# Patient Record
Sex: Female | Born: 1980
Health system: Southern US, Community
[De-identification: ages and names within clinical notes are randomized; demographics above are authoritative.]

## PROBLEM LIST (undated history)

## (undated) DIAGNOSIS — I10 Essential (primary) hypertension: Secondary | ICD-10-CM

## (undated) DIAGNOSIS — K219 Gastro-esophageal reflux disease without esophagitis: Secondary | ICD-10-CM

## (undated) DIAGNOSIS — G43909 Migraine, unspecified, not intractable, without status migrainosus: Secondary | ICD-10-CM

## (undated) DIAGNOSIS — G4733 Obstructive sleep apnea (adult) (pediatric): Secondary | ICD-10-CM

## (undated) DIAGNOSIS — I2699 Other pulmonary embolism without acute cor pulmonale: Secondary | ICD-10-CM

## (undated) DIAGNOSIS — G4730 Sleep apnea, unspecified: Secondary | ICD-10-CM

## (undated) HISTORY — DX: Sleep apnea, unspecified: G47.30

## (undated) HISTORY — PX: ABDOMINAL HYSTERECTOMY: SHX81

---

## 2005-12-12 ENCOUNTER — Ambulatory Visit: Payer: Self-pay | Admitting: Infectious Diseases

## 2005-12-12 LAB — CONVERTED CEMR LAB
BUN: 14 mg/dL (ref 6–23)
Chloride: 108 meq/L (ref 96–112)
HIV-1 RNA Quant, Log: <1.7 (ref <1.70)
Hemoglobin: 10.8 g/dL — ABNORMAL LOW (ref 11.7–14.8)
Potassium: 4.1 meq/L (ref 3.5–5.3)
RBC: 4.54 M/uL (ref 3.79–4.96)
RDW: 16.1 % — ABNORMAL HIGH (ref 11.5–15.3)
WBC: 5.8 10*3/uL (ref 3.7–10.0)

## 2010-12-01 ENCOUNTER — Emergency Department (HOSPITAL_BASED_OUTPATIENT_CLINIC_OR_DEPARTMENT_OTHER)
Admission: EM | Admit: 2010-12-01 | Discharge: 2010-12-01 | Disposition: A | Discharge status: Home / Self Care | Payer: Medicaid Other | Attending: Emergency Medicine | Admitting: Emergency Medicine

## 2010-12-01 ENCOUNTER — Encounter: Payer: Self-pay | Admitting: *Deleted

## 2010-12-01 ENCOUNTER — Emergency Department (INDEPENDENT_AMBULATORY_CARE_PROVIDER_SITE_OTHER): Payer: Medicaid Other

## 2010-12-01 DIAGNOSIS — R109 Unspecified abdominal pain: Secondary | ICD-10-CM | POA: Insufficient documentation

## 2010-12-01 DIAGNOSIS — N760 Acute vaginitis: Secondary | ICD-10-CM | POA: Insufficient documentation

## 2010-12-01 DIAGNOSIS — D259 Leiomyoma of uterus, unspecified: Secondary | ICD-10-CM

## 2010-12-01 LAB — URINALYSIS, ROUTINE W REFLEX MICROSCOPIC
Nitrite: NEGATIVE
Specific Gravity, Urine: 1.03 (ref 1.005–1.030)
pH: 5.5 (ref 5.0–8.0)

## 2010-12-01 LAB — WET PREP, GENITAL
Trich, Wet Prep: NONE SEEN
Yeast Wet Prep HPF POC: NONE SEEN

## 2010-12-01 MED ORDER — METRONIDAZOLE 500 MG PO TABS: 500.0000 mg | ORAL_TABLET | Freq: Two times a day (BID) | ORAL | Status: AC

## 2010-12-01 NOTE — ED Notes (Signed)
Pt sts she has not had a menstrual period for 6 years due to depo but recently switched to implenon and began having her period yesterday. She is c/o abd cramps, vag bleeding and HA.

## 2010-12-01 NOTE — ED Notes (Signed)
MD at bedside. 

## 2010-12-01 NOTE — ED Provider Notes (Signed)
History     CSN: [id number] Arrival date & time: 12/01/2010  1:51 PM   First MD Initiated Contact with Patient 12/01/10 1353      Chief Complaint  Patient presents with  . Abdominal Pain    (Consider location/radiation/quality/duration/timing/severity/associated sxs/prior treatment) Patient is a 30 y.o. female presenting with vaginal bleeding. The history is provided by the patient. No language interpreter was used.  Vaginal Bleeding This is a new problem. The current episode started yesterday. The problem occurs constantly. The problem has been unchanged. Associated symptoms include abdominal pain. The symptoms are aggravated by nothing. She has tried nothing for the symptoms. The treatment provided moderate relief.  Pt complains of vaginal bleeding.  Pt reports she has not had a period in 5 years.  Pt complains of cramping  History reviewed. No pertinent past medical history.  Past Surgical History  Procedure Date  . Cesarean section     No family history on file.  History  Substance Use Topics  . Smoking status: Never Smoker   . Smokeless tobacco: Not on file  . Alcohol Use: Yes    OB History    Grav Para Term Preterm Abortions TAB SAB Ect Mult Living                  Review of Systems  Unable to perform ROS Gastrointestinal: Positive for abdominal pain.  Genitourinary: Positive for vaginal bleeding.  All other systems reviewed and are negative.    Allergies  Ciprofloxacin  Home Medications  No current outpatient prescriptions on file.  BP 124/71  Pulse 73  Temp(Src) 98.5 F (36.9 C) (Oral)  Resp 18  SpO2 100%  LMP 11/30/2010  Physical Exam  Nursing note and vitals reviewed. Constitutional: She appears well-developed and well-nourished.  HENT:  Head: Normocephalic.  Eyes: Pupils are equal, round, and reactive to light.  Neck: Normal range of motion.  Cardiovascular: Normal rate.   Pulmonary/Chest: Effort normal.  Abdominal: Soft.    Genitourinary: Uterus normal.       Vaginal bleeding,  Cervix nontender,  Adnexa nontender  Musculoskeletal: Normal range of motion.  Neurological: She is alert.  Skin: Skin is warm.    ED Course  Procedures (including critical care time)  Labs Reviewed  URINALYSIS, ROUTINE W REFLEX MICROSCOPIC - Abnormal; Notable for the following:    Color, Urine AMBER (*) BIOCHEMICALS MAY BE AFFECTED BY COLOR   Appearance CLOUDY (*)    Hgb urine dipstick LARGE (*)    Bilirubin Urine SMALL (*)    Ketones, ur 15 (*)    Leukocytes, UA SMALL (*)    All other components within normal limits  WET PREP, GENITAL - Abnormal; Notable for the following:    Clue Cells, Wet Prep FEW (*)    WBC, Wet Prep HPF POC RARE (*)    All other components within normal limits  URINE MICROSCOPIC-ADD ON - Abnormal; Notable for the following:    Bacteria, UA FEW (*)    All other components within normal limits  PREGNANCY, URINE  GC/CHLAMYDIA PROBE AMP, GENITAL   No results found.   No diagnosis found.    MDM  Few clue cells    Results for orders placed during the hospital encounter of 12/01/10  URINALYSIS, ROUTINE W REFLEX MICROSCOPIC      Component Value Range   Color, Urine AMBER (*) YELLOW    Appearance CLOUDY (*) CLEAR    Specific Gravity, Urine 1.030  1.005 - 1.030  pH 5.5  5.0 - 8.0    Glucose, UA NEGATIVE  NEGATIVE (mg/dL)   Hgb urine dipstick LARGE (*) NEGATIVE    Bilirubin Urine SMALL (*) NEGATIVE    Ketones, ur 15 (*) NEGATIVE (mg/dL)   Protein, ur NEGATIVE  NEGATIVE (mg/dL)   Urobilinogen, UA 1.0  0.0 - 1.0 (mg/dL)   Nitrite NEGATIVE  NEGATIVE    Leukocytes, UA SMALL (*) NEGATIVE   PREGNANCY, URINE      Component Value Range   Preg Test, Ur NEGATIVE    WET PREP, GENITAL      Component Value Range   Yeast, Wet Prep NONE SEEN  NONE SEEN    Trich, Wet Prep NONE SEEN  NONE SEEN    Clue Cells, Wet Prep FEW (*) NONE SEEN    WBC, Wet Prep HPF POC RARE (*) NONE SEEN   URINE  MICROSCOPIC-ADD ON      Component Value Range   Squamous Epithelial / LPF RARE  RARE    WBC, UA 3-6  <3 (WBC/hpf)   RBC / HPF TOO NUMEROUS TO COUNT  <3 (RBC/hpf)   Bacteria, UA FEW (*) RARE    Urine-Other MUCOUS PRESENT     US Transvaginal Non-ob  12/01/2010  *RADIOLOGY REPORT*  Clinical Data: Heavy bleeding.  TRANSABDOMINAL AND TRANSVAGINAL ULTRASOUND OF PELVIS  Technique:  Both transabdominal and transvaginal ultrasound examinations of the pelvis were performed including evaluation of the uterus, ovaries, adnexal regions, and pelvic cul-de-sac.  Comparison: None.  Findings:  Uterus: 8.4 x 3.6 x 4.5 cm.  Mildly heterogeneous echotexture. Probable single small intramural fibroid measuring 1 cm.  Endometrium: 5 mm in thickness.  Small amount of fluid in the endometrium within the cervix.  Right Ovary: Not visualized.  No adnexal masses visualized.  Left Ovary: Not visualized.  No adnexal masses visualized.  Other Findings:  No free fluid.  IMPRESSION: Single small left intramural fibroid, 1 cm.  Ovaries not visualized, but no adnexal masses seen.  Original Report Authenticated By: Cyndie Chime, M.D.   US Pelvis Limited  12/01/2010  *RADIOLOGY REPORT*  Clinical Data: Heavy bleeding.  TRANSABDOMINAL AND TRANSVAGINAL ULTRASOUND OF PELVIS  Technique:  Both transabdominal and transvaginal ultrasound examinations of the pelvis were performed including evaluation of the uterus, ovaries, adnexal regions, and pelvic cul-de-sac.  Comparison: None.  Findings:  Uterus: 8.4 x 3.6 x 4.5 cm.  Mildly heterogeneous echotexture. Probable single small intramural fibroid measuring 1 cm.  Endometrium: 5 mm in thickness.  Small amount of fluid in the endometrium within the cervix.  Right Ovary: Not visualized.  No adnexal masses visualized.  Left Ovary: Not visualized.  No adnexal masses visualized.  Other Findings:  No free fluid.  IMPRESSION: Single small left intramural fibroid, 1 cm.  Ovaries not visualized, but no  adnexal masses seen.  Original Report Authenticated By: Cyndie Chime, M.D.       Langston Masker, [place] 12/01/10 1958

## 2010-12-01 NOTE — ED Notes (Signed)
Chart reviewed and care assumed. Awaiting ultrasound.  Pt informed of plan of care.

## 2010-12-02 LAB — GC/CHLAMYDIA PROBE AMP, GENITAL: GC Probe Amp, Genital: NEGATIVE

## 2010-12-02 NOTE — ED Provider Notes (Signed)
Medical screening examination/treatment/procedure(s) were performed by non-physician practitioner and as supervising physician I was immediately available for consultation/collaboration.   Celene Kras, MD 12/02/10 [phone]

## 2011-07-29 ENCOUNTER — Encounter (HOSPITAL_BASED_OUTPATIENT_CLINIC_OR_DEPARTMENT_OTHER): Payer: Self-pay | Admitting: *Deleted

## 2011-07-29 ENCOUNTER — Emergency Department (HOSPITAL_BASED_OUTPATIENT_CLINIC_OR_DEPARTMENT_OTHER)
Admission: EM | Admit: 2011-07-29 | Discharge: 2011-07-29 | Disposition: A | Discharge status: Home / Self Care | Payer: No Typology Code available for payment source | Attending: Emergency Medicine | Admitting: Emergency Medicine

## 2011-07-29 DIAGNOSIS — S0093XA Contusion of unspecified part of head, initial encounter: Secondary | ICD-10-CM

## 2011-07-29 DIAGNOSIS — Y998 Other external cause status: Secondary | ICD-10-CM | POA: Insufficient documentation

## 2011-07-29 DIAGNOSIS — S1093XA Contusion of unspecified part of neck, initial encounter: Secondary | ICD-10-CM | POA: Insufficient documentation

## 2011-07-29 DIAGNOSIS — Y93I9 Activity, other involving external motion: Secondary | ICD-10-CM | POA: Insufficient documentation

## 2011-07-29 DIAGNOSIS — S0003XA Contusion of scalp, initial encounter: Secondary | ICD-10-CM | POA: Insufficient documentation

## 2011-07-29 LAB — PREGNANCY, URINE: Preg Test, Ur: NEGATIVE

## 2011-07-29 MED ORDER — HYDROCODONE-ACETAMINOPHEN 5-325 MG PO TABS: 2.0000 | ORAL_TABLET | ORAL | Status: DC | PRN

## 2011-07-29 MED ORDER — HYDROCODONE-ACETAMINOPHEN 5-325 MG PO TABS: 2.0000 | ORAL_TABLET | ORAL | Status: AC | PRN

## 2011-07-29 NOTE — ED Provider Notes (Signed)
History     CSN: [id number]  Arrival date & time 07/29/11  1407   First MD Initiated Contact with Patient 07/29/11 1534      Chief Complaint  Patient presents with  . Optician, dispensing    (Consider location/radiation/quality/duration/timing/severity/associated sxs/prior treatment) Patient is a 31 y.o. female presenting with headaches. The history is provided by the patient. No language interpreter was used.  Headache  This is a new problem. The current episode started yesterday. The problem occurs constantly. The problem has been gradually worsening. The headache is associated with nothing. The patient is experiencing no pain. The pain does not radiate. She has tried nothing for the symptoms.  Pt complains of a headache since yesterday.  Pt reports she hit her head.  Pt did not lose conciousness  History reviewed. No pertinent past medical history.  Past Surgical History  Procedure Date  . Cesarean section     History reviewed. No pertinent family history.  History  Substance Use Topics  . Smoking status: Never Smoker   . Smokeless tobacco: Not on file  . Alcohol Use: Yes    OB History    Grav Para Term Preterm Abortions TAB SAB Ect Mult Living                  Review of Systems  Neurological: Positive for headaches.  All other systems reviewed and are negative.    Allergies  Ciprofloxacin  Home Medications   Current Outpatient Rx  Name Route Sig Dispense Refill  . MIDOL COMPLETE PO Oral Take 1 tablet by mouth daily as needed. Patient used this medication for cramps.    . IBUPROFEN 800 MG PO TABS Oral Take 800 mg by mouth every 8 (eight) hours as needed. Patient used this medication for her headache.      BP 135/81  Pulse 70  Temp 98.2 F (36.8 C) (Oral)  Resp 20  Ht 6' (1.829 m)  Wt 275 lb (124.739 kg)  BMI 37.30 kg/m2  SpO2 100%  Physical Exam  Nursing note and vitals reviewed. Constitutional: She is oriented to person, place, and time. She  appears well-developed and well-nourished.  HENT:  Head: Normocephalic and atraumatic.  Right Ear: External ear normal.  Left Ear: External ear normal.  Eyes: Conjunctivae and EOM are normal. Pupils are equal, round, and reactive to light.  Neck: Normal range of motion. Neck supple.  Cardiovascular: Normal rate and normal heart sounds.   Pulmonary/Chest: Effort normal.  Abdominal: Soft.  Musculoskeletal: Normal range of motion.  Neurological: She is alert and oriented to person, place, and time.  Skin: Skin is warm.    ED Course  Procedures (including critical care time)   Labs Reviewed  PREGNANCY, URINE   No results found.   1. Contusion of head       MDM  I doubt brain injury or fracture.  Pt given hydrocodone.   I advised pt to follow up with Dr. Pearletha Forge for recheck.        Lonia Skinner [place], [place] 07/29/11 1805
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
## 2011-08-02 NOTE — ED Provider Notes (Signed)
Medical screening examination/treatment/procedure(s) were performed by non-physician practitioner and as supervising physician I was immediately available for consultation/collaboration.  Cyndra Numbers, MD 08/02/11 [phone]

## 2011-09-08 ENCOUNTER — Emergency Department (HOSPITAL_BASED_OUTPATIENT_CLINIC_OR_DEPARTMENT_OTHER)
Admission: EM | Admit: 2011-09-08 | Discharge: 2011-09-08 | Disposition: A | Discharge status: Home / Self Care | Payer: Medicaid Other | Attending: Emergency Medicine | Admitting: Emergency Medicine

## 2011-09-08 ENCOUNTER — Encounter (HOSPITAL_BASED_OUTPATIENT_CLINIC_OR_DEPARTMENT_OTHER): Payer: Self-pay | Admitting: *Deleted

## 2011-09-08 DIAGNOSIS — L0390 Cellulitis, unspecified: Secondary | ICD-10-CM

## 2011-09-08 DIAGNOSIS — L02419 Cutaneous abscess of limb, unspecified: Secondary | ICD-10-CM | POA: Insufficient documentation

## 2011-09-08 DIAGNOSIS — S90569A Insect bite (nonvenomous), unspecified ankle, initial encounter: Secondary | ICD-10-CM | POA: Insufficient documentation

## 2011-09-08 DIAGNOSIS — Z881 Allergy status to other antibiotic agents status: Secondary | ICD-10-CM | POA: Insufficient documentation

## 2011-09-08 DIAGNOSIS — L03119 Cellulitis of unspecified part of limb: Secondary | ICD-10-CM | POA: Insufficient documentation

## 2011-09-08 MED ORDER — CEPHALEXIN 500 MG PO CAPS: 500.0000 mg | ORAL_CAPSULE | Freq: Four times a day (QID) | ORAL | Status: AC

## 2011-09-08 MED ORDER — SULFAMETHOXAZOLE-TRIMETHOPRIM 800-160 MG PO TABS: 2.0000 | ORAL_TABLET | Freq: Two times a day (BID) | ORAL | Status: AC

## 2011-09-08 NOTE — ED Provider Notes (Signed)
Medical screening examination/treatment/procedure(s) were conducted as a shared visit with non-physician practitioner(s) and myself.  I personally evaluated the patient during the encounter  Lower leg with localized anterior papules/petechiae warmth/erythema/tender no vessicles, no pus, no fluctuance, calf NT, not circumferential, doubt nec fasc/sepsis.  Hurman Horn, MD 09/09/11 [phone]

## 2011-09-08 NOTE — ED Notes (Signed)
Pt thinks she was bitten by a bug on her left lower ext. Redness has spread. +itching and burning.

## 2011-09-08 NOTE — ED Provider Notes (Signed)
History     CSN: [id number]  Arrival date & time 09/08/11  1925   First MD Initiated Contact with Patient 09/08/11 1957      Chief Complaint  Patient presents with  . Insect Bite    (Consider location/radiation/quality/duration/timing/severity/associated sxs/prior treatment) HPI Comments: Patient presenting with a rash of the left lower leg that has been present since last evening.  Area gradually spreading.  No drainage from the area.  Area is painful, but does not itch.  No known insect bite.  No known contact exposure.  She has never had anything like this before.  She denies any trauma to the area or breakage in skin.  She denies any pain or burning prior to the development of the rash.  She denies fever or chills.  No nausea or vomiting.  She has not tried any type of treatment prior to coming to the ED.    The history is provided by the patient.    History reviewed. No pertinent past medical history.  Past Surgical History  Procedure Date  . Cesarean section     History reviewed. No pertinent family history.  History  Substance Use Topics  . Smoking status: Never Smoker   . Smokeless tobacco: Not on file  . Alcohol Use: Yes    OB History    Grav Para Term Preterm Abortions TAB SAB Ect Mult Living                  Review of Systems  Constitutional: Negative for fever and chills.  HENT: Negative for sore throat, neck pain and neck stiffness.   Gastrointestinal: Negative for nausea and vomiting.  Skin: Positive for rash.  Neurological: Negative for headaches.    Allergies  Ciprofloxacin  Home Medications   Current Outpatient Rx  Name Route Sig Dispense Refill  . IBUPROFEN 200 MG PO TABS Oral Take 200 mg by mouth every 6 (six) hours as needed. For headache.    Marland Kitchen MIDOL COMPLETE PO Oral Take 1 tablet by mouth daily as needed. Patient used this medication for cramps.      BP 133/82  Pulse 82  Temp 98.1 F (36.7 C) (Oral)  Resp 20  Ht 6' (1.829 m)  Wt  299 lb (135.626 kg)  BMI 40.55 kg/m2  SpO2 100%  LMP 08/23/2011  Physical Exam  Nursing note and vitals reviewed. Constitutional: She appears well-developed and well-nourished. No distress.  HENT:  Head: Normocephalic and atraumatic.  Mouth/Throat: Oropharynx is clear and moist.  Neck: Normal range of motion. Neck supple.  Cardiovascular: Normal rate, regular rhythm and normal heart sounds.   Pulmonary/Chest: Effort normal and breath sounds normal. No respiratory distress. She has no wheezes.  Musculoskeletal: Normal range of motion.  Neurological: She is alert.  Skin: Rash noted. She is not diaphoretic.     Psychiatric: She has a normal mood and affect.    ED Course  Procedures (including critical care time)  Labs Reviewed - No data to display No results found.   No diagnosis found.    MDM  Rash consistent with localized cellulitis.  Patient afebrile.  No systemic symptoms.  Patient discharged home with prescription for Bactrim DS and Keflex.  Return precautions have been discussed.        Pascal Lux [place], PA-C 09/08/11 2211

## 2015-05-23 ENCOUNTER — Encounter (HOSPITAL_BASED_OUTPATIENT_CLINIC_OR_DEPARTMENT_OTHER): Payer: Self-pay

## 2015-05-23 ENCOUNTER — Emergency Department (HOSPITAL_BASED_OUTPATIENT_CLINIC_OR_DEPARTMENT_OTHER)
Admission: EM | Admit: 2015-05-23 | Discharge: 2015-05-23 | Disposition: A | Discharge status: Home / Self Care | Payer: Medicaid Other | Attending: Emergency Medicine | Admitting: Emergency Medicine

## 2015-05-23 DIAGNOSIS — M25562 Pain in left knee: Secondary | ICD-10-CM | POA: Diagnosis present

## 2015-05-23 MED ORDER — IBUPROFEN 400 MG PO TABS: 400.0000 mg | ORAL_TABLET | Freq: Four times a day (QID) | ORAL | Status: DC | PRN

## 2015-05-23 MED FILL — IBUPROFEN 400 MG TABLET: 400 | 7 days supply | Qty: 30 | Fill #0

## 2015-05-23 NOTE — ED Provider Notes (Signed)
CSN: [id number]     Arrival date & time 05/23/15  1608 History   First MD Initiated Contact with Patient 05/23/15 1620     Chief Complaint  Patient presents with  . Knee Pain     (Consider location/radiation/quality/duration/timing/severity/associated sxs/prior Treatment) HPI Comments: Patient presents to the ED with a chief complaint of left knee pain.  She states that she slept on her knee wrong.  She denies any other injuries.  She states that it feels tight when she walks.  She denies any clicking, locking, or popping.  She denies any fevers or chills.  Denies any calf pain or swelling.  She has not taken anything for her symptoms.  The history is provided by the patient. No language interpreter was used.    History reviewed. No pertinent past medical history. Past Surgical History  Procedure Laterality Date  . Cesarean section    . Cesarean section     No family history on file. Social History  Substance Use Topics  . Smoking status: Never Smoker   . Smokeless tobacco: None  . Alcohol Use: Yes   OB History    No data available     Review of Systems  Constitutional: Negative for fever and chills.  Respiratory: Negative for shortness of breath.   Cardiovascular: Negative for chest pain.  Gastrointestinal: Negative for nausea, vomiting, diarrhea and constipation.  Genitourinary: Negative for dysuria.  Musculoskeletal: Positive for arthralgias.      Allergies  Ciprofloxacin  Home Medications   Prior to Admission medications   Not on File   BP 133/78 mmHg  Pulse 88  Temp(Src) 99.2 F (37.3 C) (Oral)  Resp 18  Ht 6' (1.829 m)  Wt 131.543 kg  BMI 39.32 kg/m2  SpO2 100% Physical Exam Physical Exam  Constitutional: Pt appears well-developed and well-nourished. No distress.  HENT:  Head: Normocephalic and atraumatic.  Eyes: Conjunctivae are normal.  Neck: Normal range of motion.  Cardiovascular: Normal rate, regular rhythm and intact distal pulses.    Capillary refill < 3 sec  Pulmonary/Chest: Effort normal and breath sounds normal.  Musculoskeletal: Pt exhibits tenderness at the anterolateral joint line. Pt exhibits no edema.  ROM: 5/5  Neurological: Pt  is alert. Coordination normal.  Sensation 5/5 Strength 5/5  Skin: Skin is warm and dry. Pt is not diaphoretic.  No tenting of the skin  Psychiatric: Pt has a normal mood and affect.  Nursing note and vitals reviewed.  ED Course  Procedures (including critical care time)   MDM   Final diagnoses:  Left knee pain    Patient with left knee pain.  No injuries.  Ambulatory.  No sign of septic joint, dvt, or infection.  No bony abnormality or deformity.  Plan for knee sleeve.  Recommend ortho follow-up for evaluation of meniscus.       Montine Circle, PA-C 05/23/15 1647  Fredia Sorrow, MD 05/23/15 [phone]

## 2015-05-23 NOTE — Discharge Instructions (Signed)

## 2015-05-23 NOTE — ED Notes (Signed)
Left knee pain started last night-denies injury-slow steady gait

## 2016-04-11 ENCOUNTER — Encounter (HOSPITAL_BASED_OUTPATIENT_CLINIC_OR_DEPARTMENT_OTHER): Payer: Self-pay | Admitting: Emergency Medicine

## 2016-04-11 ENCOUNTER — Emergency Department (HOSPITAL_BASED_OUTPATIENT_CLINIC_OR_DEPARTMENT_OTHER)
Admission: EM | Admit: 2016-04-11 | Discharge: 2016-04-11 | Disposition: A | Discharge status: Home / Self Care | Payer: Medicaid Other | Attending: Emergency Medicine | Admitting: Emergency Medicine

## 2016-04-11 ENCOUNTER — Emergency Department (HOSPITAL_BASED_OUTPATIENT_CLINIC_OR_DEPARTMENT_OTHER): Payer: Medicaid Other

## 2016-04-11 DIAGNOSIS — W19XXXA Unspecified fall, initial encounter: Secondary | ICD-10-CM

## 2016-04-11 DIAGNOSIS — Y9301 Activity, walking, marching and hiking: Secondary | ICD-10-CM | POA: Diagnosis not present

## 2016-04-11 DIAGNOSIS — W010XXA Fall on same level from slipping, tripping and stumbling without subsequent striking against object, initial encounter: Secondary | ICD-10-CM | POA: Insufficient documentation

## 2016-04-11 DIAGNOSIS — Y999 Unspecified external cause status: Secondary | ICD-10-CM | POA: Insufficient documentation

## 2016-04-11 DIAGNOSIS — Y929 Unspecified place or not applicable: Secondary | ICD-10-CM | POA: Diagnosis not present

## 2016-04-11 DIAGNOSIS — M79662 Pain in left lower leg: Secondary | ICD-10-CM | POA: Diagnosis not present

## 2016-04-11 DIAGNOSIS — M79605 Pain in left leg: Secondary | ICD-10-CM

## 2016-04-11 DIAGNOSIS — S8992XA Unspecified injury of left lower leg, initial encounter: Secondary | ICD-10-CM | POA: Diagnosis present

## 2016-04-11 MED ORDER — IBUPROFEN 400 MG PO TABS: 400.0000 mg | ORAL_TABLET | Freq: Four times a day (QID) | ORAL | 0 refills | Status: DC | PRN

## 2016-04-11 MED ORDER — HYDROCODONE-ACETAMINOPHEN 5-325 MG PO TABS
1.0000 | ORAL_TABLET | Freq: Once | ORAL | Status: AC
  Administered 2016-04-11: 1 via ORAL
  Filled 2016-04-11: qty 1

## 2016-04-11 MED FILL — IBUPROFEN 400 MG TABLET: 400 | 8 days supply | Qty: 30 | Fill #0

## 2016-04-11 NOTE — ED Triage Notes (Signed)
Pt fell last night and injured her left knee and ankle.  Pt states she has increased pain with movement.

## 2016-04-11 NOTE — Discharge Instructions (Signed)
Imaging shows no signs of fractures. This is likely a musculoskeletal sprain. Please wear the brace for comfort. Use the crutches as needed. Ambulate as tolerated. Rest, ice, elevate her left leg. Motrin and Tylenol for pain. Have given you a referral to orthopedist if her symptoms do not improve. Return to ED if your symptoms worsen.

## 2016-04-11 NOTE — ED Notes (Signed)
Patient transported to X-ray 

## 2016-04-11 NOTE — ED Provider Notes (Signed)
[facility] Provider Note   CSN: [id number] Arrival date & time: 04/11/16  1039     History   Chief Complaint Chief Complaint  Patient presents with  . Fall    HPI Meagan Hahn is a 36 y.o. female.  HPI 36 year old African American female with no significant past medical history presents to the ED today with complaints of left knee and left ankle pain following mechanical fall last night. Patient states she was walking downstairs when she tripped and twisted her left leg. She had immediate pain to her left knee and left ankle. Minimal swelling noted. She did not ambulate since the event. Has not tried anything at home for her symptoms. Denies any weakness, paresthesias, wounds. Denies hitting her head or LOC. Denies neck or back pain. No past medical history on file.  There are no active problems to display for this patient.   Past Surgical History:  Procedure Laterality Date  . CESAREAN SECTION    . CESAREAN SECTION      OB History    No data available       Home Medications    Prior to Admission medications   Not on File    Family History No family history on file.  Social History Social History  Substance Use Topics  . Smoking status: Never Smoker  . Smokeless tobacco: Never Used  . Alcohol use Yes     Allergies   Ciprofloxacin   Review of Systems Review of Systems  Constitutional: Negative for chills and fever.  Musculoskeletal: Positive for arthralgias, gait problem and joint swelling.  Skin: Negative for wound.  Neurological: Negative for syncope, weakness, numbness and headaches.     Physical Exam Updated Vital Signs BP 134/80 (BP Location: Left Arm)   Pulse 79   Temp 98.5 F (36.9 C) (Oral)   Resp 18   Ht 6' (1.829 m)   Wt 122.5 kg   LMP 03/24/2016   SpO2 99%   BMI 36.62 kg/m   Physical Exam  Constitutional: She appears well-developed and well-nourished. No distress.  Eyes: Right eye exhibits no discharge.  Left eye exhibits no discharge. No scleral icterus.  Cardiovascular: Intact distal pulses.   Pulmonary/Chest: No respiratory distress.  Musculoskeletal: Normal range of motion. She exhibits tenderness.       Left knee: She exhibits bony tenderness. She exhibits normal range of motion, no swelling, no effusion, no ecchymosis, no deformity, no erythema, normal alignment, normal patellar mobility and normal meniscus. Tenderness found. Medial joint line and patellar tendon tenderness noted.       Left ankle: She exhibits normal range of motion, no swelling, no ecchymosis and no deformity. Tenderness. Lateral malleolus tenderness found. No medial malleolus, no AITFL, no CF ligament, no head of 5th metatarsal and no proximal fibula tenderness found.  Mild tenderness to palpation of the left knee and left ankle. No edema or ecchymosis noted. No joint laxity noted. She has tenderness over the patellar tendon. Full range of motion. Negative McMurphy's sign. Negative anterior drawer test. Patient with tenderness to the lateral malleolus without any edema or ecchymosis. DP pulses are 2+ bilaterally. Strength 5 out of 5 in lower extremities. Cap refill normal. Patient has noted tenderness to palpation in the left hip. Full range of motion. Able to ambulate with normal gait.  Neurological: She is alert.  Skin: Skin is warm and dry. Capillary refill takes less than 2 seconds. No pallor.  Nursing note and vitals reviewed.  ED Treatments / Results  Labs (all labs ordered are listed, but only abnormal results are displayed) Labs Reviewed - No data to display  EKG  EKG Interpretation None       Radiology Dg Tibia/fibula Left  Result Date: 04/11/2016 CLINICAL DATA:  Status post fall.  Left anterior knee pain. EXAM: LEFT TIBIA AND FIBULA - 2 VIEW COMPARISON:  None. FINDINGS: There is no evidence of fracture or other focal bone lesions. 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  Severe patellofemoral compartment osteoarthritis. Soft tissues are unremarkable. IMPRESSION: No acute osseous injury of the left tibia or fibula. Electronically Signed   By: Kathreen Devoid   On: 04/11/2016 11:21   Dg Femur Min 2 Views Left  Result Date: 04/11/2016 CLINICAL DATA:  Left leg pain after fall. EXAM: LEFT FEMUR 2 VIEWS COMPARISON:  None. FINDINGS: There is no evidence of fracture or other focal bone lesions. Soft tissues are unremarkable. IMPRESSION: Normal left femur. Electronically Signed   By: Marijo Conception, M.D.   On: 04/11/2016 11:50    Procedures Procedures (including critical care time)  Medications Ordered in ED Medications  HYDROcodone-acetaminophen (NORCO/VICODIN) 5-325 MG per tablet 1 tablet (1 tablet Oral Given 04/11/16 1207)     Initial Impression / Assessment and Plan / ED Course  I have reviewed the triage vital signs and the nursing notes.  Pertinent labs & imaging results that were available during my care of the patient were reviewed by me and considered in my medical decision making (see chart for details).     Patient with left knee and left ankle pain following a mechanical fall. Denies hitting her head or LOC. Denies any other pain. Neurovascularly intact with normal strength. Patient X-Ray negative for obvious fracture or dislocation. Pain managed in ED. Pt advised to follow up with orthopedics if symptoms persist for possibility of missed fracture diagnosis. Patient given brace while in ED, conservative therapy recommended and discussed. Patient will be dc home & is agreeable with above plan.   Final Clinical Impressions(s) / ED Diagnoses   Final diagnoses:  Fall, initial encounter  Left leg pain    New Prescriptions New Prescriptions   No medications on file     Doristine Devoid, PA-C 04/11/16 [place], MD 04/11/16 1424

## 2017-06-08 ENCOUNTER — Encounter (HOSPITAL_BASED_OUTPATIENT_CLINIC_OR_DEPARTMENT_OTHER): Payer: Self-pay | Admitting: Emergency Medicine

## 2017-06-08 ENCOUNTER — Other Ambulatory Visit: Payer: Self-pay

## 2017-06-08 ENCOUNTER — Emergency Department (HOSPITAL_BASED_OUTPATIENT_CLINIC_OR_DEPARTMENT_OTHER)
Admission: EM | Admit: 2017-06-08 | Discharge: 2017-06-08 | Disposition: A | Discharge status: Home / Self Care | Payer: Medicaid Other | Attending: Emergency Medicine | Admitting: Emergency Medicine

## 2017-06-08 ENCOUNTER — Emergency Department (HOSPITAL_BASED_OUTPATIENT_CLINIC_OR_DEPARTMENT_OTHER): Payer: Medicaid Other

## 2017-06-08 DIAGNOSIS — R1010 Upper abdominal pain, unspecified: Secondary | ICD-10-CM | POA: Diagnosis not present

## 2017-06-08 DIAGNOSIS — G44019 Episodic cluster headache, not intractable: Secondary | ICD-10-CM | POA: Diagnosis not present

## 2017-06-08 DIAGNOSIS — R109 Unspecified abdominal pain: Secondary | ICD-10-CM

## 2017-06-08 DIAGNOSIS — R51 Headache: Secondary | ICD-10-CM | POA: Diagnosis present

## 2017-06-08 LAB — COMPREHENSIVE METABOLIC PANEL
ALT: 15 U/L (ref 14–54)
AST: 18 U/L (ref 15–41)
Albumin: 3.7 g/dL (ref 3.5–5.0)
Alkaline Phosphatase: 66 U/L (ref 38–126)
Anion gap: 4 — ABNORMAL LOW (ref 5–15)
BUN: 16 mg/dL (ref 6–20)
CO2: 24 mmol/L (ref 22–32)
Calcium: 8.5 mg/dL — ABNORMAL LOW (ref 8.9–10.3)
Chloride: 111 mmol/L (ref 101–111)
Creatinine, Ser: 0.87 mg/dL (ref 0.44–1.00)
GFR calc Af Amer: >60 mL/min (ref >60)
GFR calc non Af Amer: >60 mL/min (ref >60)
Glucose, Bld: 95 mg/dL (ref 65–99)
Potassium: 4.2 mmol/L (ref 3.5–5.1)
Sodium: 139 mmol/L (ref 135–145)
Total Bilirubin: 0.8 mg/dL (ref 0.3–1.2)
Total Protein: 7 g/dL (ref 6.5–8.1)

## 2017-06-08 LAB — URINALYSIS, ROUTINE W REFLEX MICROSCOPIC
Bilirubin Urine: NEGATIVE
Glucose, UA: NEGATIVE mg/dL
Hgb urine dipstick: NEGATIVE
Ketones, ur: NEGATIVE mg/dL
Leukocytes, UA: NEGATIVE
Nitrite: NEGATIVE
Protein, ur: NEGATIVE mg/dL
Specific Gravity, Urine: 1.02 (ref 1.005–1.030)
pH: 7.5 (ref 5.0–8.0)

## 2017-06-08 LAB — CBC
HCT: 32 % — ABNORMAL LOW (ref 36.0–46.0)
Hemoglobin: 10.8 g/dL — ABNORMAL LOW (ref 12.0–15.0)
MCH: 29.6 pg (ref 26.0–34.0)
MCHC: 33.8 g/dL (ref 30.0–36.0)
MCV: 87.7 fL (ref 78.0–100.0)
Platelets: 224 10*3/uL (ref 150–400)
RBC: 3.65 MIL/uL — ABNORMAL LOW (ref 3.87–5.11)
RDW: 12.9 % (ref 11.5–15.5)
WBC: 4.4 10*3/uL (ref 4.0–10.5)

## 2017-06-08 LAB — LIPASE, BLOOD: Lipase: 25 U/L (ref 11–51)

## 2017-06-08 LAB — PREGNANCY, URINE: Preg Test, Ur: NEGATIVE

## 2017-06-08 MED ORDER — KETOROLAC TROMETHAMINE 15 MG/ML IJ SOLN
15.0000 mg | Freq: Once | INTRAMUSCULAR | Status: AC
  Administered 2017-06-08: 15 mg via INTRAVENOUS
  Filled 2017-06-08: qty 1

## 2017-06-08 MED ORDER — LACTATED RINGERS IV BOLUS
1000.0000 mL | Freq: Once | INTRAVENOUS | Status: AC
  Administered 2017-06-08: 1000 mL via INTRAVENOUS

## 2017-06-08 MED ORDER — HYDROMORPHONE HCL 1 MG/ML IJ SOLN
1.0000 mg | Freq: Once | INTRAMUSCULAR | Status: DC
  Filled 2017-06-08: qty 1

## 2017-06-08 MED ORDER — GI COCKTAIL ~~LOC~~
30.0000 mL | Freq: Once | ORAL | Status: AC
  Administered 2017-06-08: 30 mL via ORAL
  Filled 2017-06-08: qty 30

## 2017-06-08 MED ORDER — PANTOPRAZOLE SODIUM 20 MG PO TBEC: 20.0000 mg | DELAYED_RELEASE_TABLET | Freq: Every day | ORAL | 0 refills | Status: DC

## 2017-06-08 NOTE — ED Notes (Signed)
Patient transported to Ultrasound 

## 2017-06-08 NOTE — ED Triage Notes (Addendum)
Pt c/o abd pain, headache, with nausea since yesterday. Pt sent from UC for further eval.

## 2017-06-12 NOTE — ED Provider Notes (Signed)
[place] EMERGENCY DEPARTMENT Provider Note   CSN: [id number] Arrival date & time: 06/08/17  1045     History   Chief Complaint Chief Complaint  Patient presents with  . Abdominal Pain  . Headache    HPI Meagan Hahn is a 37 y.o. female.  HPI   36yF with abdominal pain and nausea. Pain is in upper abdomen. Comes and goes. Has had for several weeks. Worse in last few days. Also c/o L sided headache. Feels like behind her eye. assciated runny nose. No change sin vision. No fever or chills.   History reviewed. No pertinent past medical history.  There are no active problems to display for this patient.   Past Surgical History:  Procedure Laterality Date  . CESAREAN SECTION    . CESAREAN SECTION       OB History   None      Home Medications    Prior to Admission medications   Medication Sig Start Date End Date Taking? Authorizing Provider  ibuprofen (ADVIL,MOTRIN) 400 MG tablet Take 1 tablet (400 mg total) by mouth every 6 (six) hours as needed. 04/11/16   Doristine Devoid, PA-C  pantoprazole (PROTONIX) 20 MG tablet Take 1 tablet (20 mg total) by mouth daily. 06/08/17   Virgel Manifold, MD    Family History No family history on file.  Social History Social History   Tobacco Use  . Smoking status: Never Smoker  . Smokeless tobacco: Never Used  Substance Use Topics  . Alcohol use: Yes  . Drug use: No     Allergies   Ciprofloxacin   Review of Systems Review of Systems  All systems reviewed and negative, other than as noted in HPI.  Physical Exam Updated Vital Signs BP 127/88 (BP Location: Left Arm)   Pulse (!) 58   Temp 98.3 F (36.8 C) (Oral)   Resp 18   Ht 5\' 9"  (1.753 m)   Wt (!) 149.7 kg (330 lb)   LMP 05/29/2017   SpO2 100%   BMI 48.73 kg/m   Physical Exam  Constitutional: She appears well-developed and well-nourished. No distress.  HENT:  Head: Normocephalic and atraumatic.  Eyes: Conjunctivae are normal. Right  eye exhibits no discharge. Left eye exhibits no discharge.  Neck: Neck supple.  Cardiovascular: Normal rate, regular rhythm and normal heart sounds. Exam reveals no gallop and no friction rub.  No murmur heard. Pulmonary/Chest: Effort normal and breath sounds normal. No respiratory distress.  Abdominal: Soft. She exhibits no distension. There is tenderness.  Mild ttp w/o rebound or guarding  Musculoskeletal: She exhibits no edema or tenderness.  Neurological: She is alert.  Skin: Skin is warm and dry.  Psychiatric: She has a normal mood and affect. Her behavior is normal. Thought content normal.  Nursing note and vitals reviewed.    ED Treatments / Results  Labs (all labs ordered are listed, but only abnormal results are displayed) Labs Reviewed  URINALYSIS, ROUTINE W REFLEX MICROSCOPIC - Abnormal; Notable for the following components:      Result Value   APPearance CLOUDY (*)    All other components within normal limits  COMPREHENSIVE METABOLIC PANEL - Abnormal; Notable for the following components:   Calcium 8.5 (*)    Anion gap 4 (*)    All other components within normal limits  CBC - Abnormal; Notable for the following components:   RBC 3.65 (*)    Hemoglobin 10.8 (*)    HCT 32.0 (*)  All other components within normal limits  PREGNANCY, URINE  LIPASE, BLOOD    EKG None  Radiology No results found.   US Abdomen Limited Ruq  Result Date: 06/08/2017 CLINICAL DATA:  Upper abdominal pain for 2 weeks EXAM: ULTRASOUND ABDOMEN LIMITED RIGHT UPPER QUADRANT COMPARISON:  None. FINDINGS: Gallbladder: No gallbladder distension. No pericholecystic fluid. No gallbladder wall thickening. There is a 5 mm polyp within the lumen of the gallbladder. No echogenic gallstones identified Common bile duct: Diameter: Normal at 1 mm Liver: No focal lesion identified. Within normal limits in parenchymal echogenicity. Portal vein is patent on color Doppler imaging with normal direction of blood  flow towards the liver. IMPRESSION: 1. No evidence of cholecystitis. 2. No biliary duct dilatation. 3. Small gallbladder polyps (less than 10 mm) are considered benign by consensus criteria. Electronically Signed   By: Suzy Bouchard M.D.   On: 06/08/2017 13:02    Procedures Procedures (including critical care time)  Medications Ordered in ED Medications  lactated ringers bolus 1,000 mL (0 mLs Intravenous Stopped 06/08/17 1400)  ketorolac (TORADOL) 15 MG/ML injection 15 mg (15 mg Intravenous Given 06/08/17 1252)  gi cocktail (Maalox,Lidocaine,Donnatal) (30 mLs Oral Given 06/08/17 1316)     Initial Impression / Assessment and Plan / ED Course  I have reviewed the triage vital signs and the nursing notes.  Pertinent labs & imaging results that were available during my care of the patient were reviewed by me and considered in my medical decision making (see chart for details).    36yF with headache and abdominal pain. Suspect HA may be cluster. Abdominal pain from pud? US unremarkable. Only mild tenderness on exam. Plan ppi. outpt FU.   Final Clinical Impressions(s) / ED Diagnoses   Final diagnoses:  Episodic cluster headache, not intractable  Pain of upper abdomen    ED Discharge Orders        Ordered    pantoprazole (PROTONIX) 20 MG tablet  Daily     06/08/17 1352       Virgel Manifold, MD 06/12/17 [phone]

## 2017-11-27 ENCOUNTER — Encounter (HOSPITAL_BASED_OUTPATIENT_CLINIC_OR_DEPARTMENT_OTHER): Payer: Self-pay | Admitting: *Deleted

## 2017-11-27 ENCOUNTER — Other Ambulatory Visit: Payer: Self-pay

## 2017-11-27 ENCOUNTER — Emergency Department (HOSPITAL_BASED_OUTPATIENT_CLINIC_OR_DEPARTMENT_OTHER)
Admission: EM | Admit: 2017-11-27 | Discharge: 2017-11-27 | Disposition: A | Discharge status: Home / Self Care | Payer: 59 | Attending: Emergency Medicine | Admitting: Emergency Medicine

## 2017-11-27 DIAGNOSIS — J209 Acute bronchitis, unspecified: Secondary | ICD-10-CM

## 2017-11-27 DIAGNOSIS — Z79899 Other long term (current) drug therapy: Secondary | ICD-10-CM | POA: Insufficient documentation

## 2017-11-27 DIAGNOSIS — R05 Cough: Secondary | ICD-10-CM | POA: Diagnosis present

## 2017-11-27 MED ORDER — AMOXICILLIN-POT CLAVULANATE 875-125 MG PO TABS: 1.0000 | ORAL_TABLET | Freq: Two times a day (BID) | ORAL | 0 refills | Status: DC

## 2017-11-27 MED ORDER — ALBUTEROL SULFATE HFA 108 (90 BASE) MCG/ACT IN AERS: 2.0000 | INHALATION_SPRAY | RESPIRATORY_TRACT | 0 refills | Status: DC | PRN

## 2017-11-27 MED FILL — AMOX-CLAV 875-125 MG TABLET: 875-125 | 7 days supply | Qty: 14 | Fill #0

## 2017-11-27 MED FILL — VENTOLIN HFA 90 MCG INHALER: 108 (90 BAS | 17 days supply | Qty: 18 | Fill #0

## 2017-11-27 NOTE — ED Provider Notes (Signed)
[place] EMERGENCY DEPARTMENT Provider Note   CSN: [id number] Arrival date & time: 11/27/17  3300     History   Chief Complaint Chief Complaint  Patient presents with  . Congestion, Coughing    HPI Meagan Hahn is a 37 y.o. female.  HPI Patient is a 37 year old female presents the emergency department with cough and congestion over the past 4 to 5 days.  She reports mild sore throat.  No fevers or chills.  No history of asthma or COPD.  She does not take bronchodilators at home.  Her cough is been semi-productive.  Denies chest pain.  No abdominal pain.  Denies nausea vomiting diarrhea.  Symptoms are mild in severity.   History reviewed. No pertinent past medical history.  There are no active problems to display for this patient.   Past Surgical History:  Procedure Laterality Date  . CESAREAN SECTION    . CESAREAN SECTION       OB History    Gravida  2   Para      Term      Preterm      AB      Living  2     SAB      TAB      Ectopic      Multiple      Live Births               Home Medications    Prior to Admission medications   Medication Sig Start Date End Date Taking? Authorizing Provider  albuterol (PROVENTIL HFA;VENTOLIN HFA) 108 (90 Base) MCG/ACT inhaler Inhale 2 puffs into the lungs every 4 (four) hours as needed for wheezing or shortness of breath. 11/27/17   Jola Schmidt, MD  amoxicillin-clavulanate (AUGMENTIN) 875-125 MG tablet Take 1 tablet by mouth every 12 (twelve) hours. 11/27/17   Jola Schmidt, MD  ibuprofen (ADVIL,MOTRIN) 400 MG tablet Take 1 tablet (400 mg total) by mouth every 6 (six) hours as needed. 04/11/16   Doristine Devoid, PA-C  pantoprazole (PROTONIX) 20 MG tablet Take 1 tablet (20 mg total) by mouth daily. 06/08/17   Virgel Manifold, MD    Family History History reviewed. No pertinent family history.  Social History Social History   Tobacco Use  . Smoking status: Never Smoker  . Smokeless  tobacco: Never Used  Substance Use Topics  . Alcohol use: Yes    Comment: occasionally  . Drug use: No     Allergies   Ciprofloxacin   Review of Systems Review of Systems  All other systems reviewed and are negative.    Physical Exam Updated Vital Signs BP 125/90 (BP Location: Left Arm)   Pulse 85   Temp 98.1 F (36.7 C) (Oral)   Resp 18   Ht 5\' 10"  (1.778 m)   Wt 117.9 kg   LMP 11/17/2017   SpO2 100%   BMI 37.31 kg/m   Physical Exam  Constitutional: She is oriented to person, place, and time. She appears well-developed and well-nourished. No distress.  HENT:  Head: Normocephalic and atraumatic.  Eyes: EOM are normal.  Neck: Normal range of motion.  Cardiovascular: Normal rate, regular rhythm and normal heart sounds.  Pulmonary/Chest: Effort normal and breath sounds normal.  Abdominal: Soft. She exhibits no distension. There is no tenderness.  Musculoskeletal: Normal range of motion.  Neurological: She is alert and oriented to person, place, and time.  Skin: Skin is warm and dry.  Psychiatric: She has a normal  mood and affect. Judgment normal.  Nursing note and vitals reviewed.    ED Treatments / Results  Labs (all labs ordered are listed, but only abnormal results are displayed) Labs Reviewed - No data to display  EKG None  Radiology No results found.  Procedures Procedures (including critical care time)  Medications Ordered in ED Medications - No data to display   Initial Impression / Assessment and Plan / ED Course  I have reviewed the triage vital signs and the nursing notes.  Pertinent labs & imaging results that were available during my care of the patient were reviewed by me and considered in my medical decision making (see chart for details).     Suspect acute bronchitis.  Patient will be prescribed an antibiotic and bronchodilator for symptom control.  Close primary care follow-up.  Patient encouraged to return to the emergency  department for new or worsening symptoms  Final Clinical Impressions(s) / ED Diagnoses   Final diagnoses:  Acute bronchitis, unspecified organism    ED Discharge Orders         Ordered    amoxicillin-clavulanate (AUGMENTIN) 875-125 MG tablet  Every 12 hours     11/27/17 0945    albuterol (PROVENTIL HFA;VENTOLIN HFA) 108 (90 Base) MCG/ACT inhaler  Every 4 hours PRN     11/27/17 0945           Jola Schmidt, MD 11/27/17 [phone]

## 2017-11-27 NOTE — ED Triage Notes (Signed)
Congestion, coughing and burning sensation throat started last week, Thursday.

## 2019-04-16 ENCOUNTER — Emergency Department (HOSPITAL_BASED_OUTPATIENT_CLINIC_OR_DEPARTMENT_OTHER): Payer: Federal, State, Local not specified - PPO

## 2019-04-16 ENCOUNTER — Encounter (HOSPITAL_BASED_OUTPATIENT_CLINIC_OR_DEPARTMENT_OTHER): Payer: Self-pay | Admitting: *Deleted

## 2019-04-16 ENCOUNTER — Other Ambulatory Visit: Payer: Self-pay

## 2019-04-16 ENCOUNTER — Emergency Department (HOSPITAL_BASED_OUTPATIENT_CLINIC_OR_DEPARTMENT_OTHER)
Admission: EM | Admit: 2019-04-16 | Discharge: 2019-04-16 | Disposition: A | Discharge status: Home / Self Care | Payer: Federal, State, Local not specified - PPO | Attending: Emergency Medicine | Admitting: Emergency Medicine

## 2019-04-16 DIAGNOSIS — R6883 Chills (without fever): Secondary | ICD-10-CM | POA: Insufficient documentation

## 2019-04-16 DIAGNOSIS — Z20822 Contact with and (suspected) exposure to covid-19: Secondary | ICD-10-CM | POA: Diagnosis not present

## 2019-04-16 DIAGNOSIS — Z881 Allergy status to other antibiotic agents status: Secondary | ICD-10-CM | POA: Insufficient documentation

## 2019-04-16 DIAGNOSIS — R110 Nausea: Secondary | ICD-10-CM | POA: Diagnosis not present

## 2019-04-16 DIAGNOSIS — R0602 Shortness of breath: Secondary | ICD-10-CM | POA: Diagnosis not present

## 2019-04-16 DIAGNOSIS — R059 Cough, unspecified: Secondary | ICD-10-CM

## 2019-04-16 DIAGNOSIS — R05 Cough: Secondary | ICD-10-CM

## 2019-04-16 NOTE — ED Notes (Signed)
ED Provider at bedside. 

## 2019-04-16 NOTE — Discharge Instructions (Addendum)
Remain home from work.  Take over-the-counter medications as needed.  Follow-up with your doctor to be rechecked and consider repeat covid testing later this week if symptoms persist

## 2019-04-16 NOTE — ED Triage Notes (Signed)
Chills, body aches since Tuesday. Tested for Covid yesterday, negative.

## 2019-04-16 NOTE — ED Provider Notes (Addendum)
[place] EMERGENCY DEPARTMENT Provider Note   CSN: [id number] Arrival date & time: 04/16/19  1031     History Chief Complaint  Patient presents with  . Chills, body aches  . Shortness of Breath    Meagan Hahn is a 39 y.o. female.  HPI   Patient started having symptoms on Tuesday.  Meagan Hahn began having chills body aches and cough.  Meagan Hahn has had some nausea as well.  Patient works at the [employer].  Meagan Hahn has been exposed to people with Covid.  Meagan Hahn went got tested yesterday and was told today that the results were negative.  Meagan Hahn continues to have the same symptoms and is not feeling any better.  Meagan Hahn denies any dysuria.  No abdominal pain.  No diarrhea.  No vomiting.    History reviewed. No pertinent past medical history.  There are no problems to display for this patient.   Past Surgical History:  Procedure Laterality Date  . CESAREAN SECTION    . CESAREAN SECTION       OB History    Gravida  3   Para  2   Term      Preterm      AB  1   Living        SAB      TAB      Ectopic      Multiple      Live Births              History reviewed. No pertinent family history.  Social History   Tobacco Use  . Smoking status: Never Smoker  . Smokeless tobacco: Never Used  Substance Use Topics  . Alcohol use: Yes    Comment: occasionally  . Drug use: No    Home Medications Prior to Admission medications   Medication Sig Start Date End Date Taking? Authorizing Provider  valACYclovir (VALTREX) 500 MG tablet Take 500 mg by mouth daily.   Yes [provider]    Allergies    Ciprofloxacin  Review of Systems   Review of Systems  All other systems reviewed and are negative.   Physical Exam Updated Vital Signs BP 102/80 (BP Location: Right Arm)   Pulse 91   Temp 99.6 F (37.6 C) (Oral)   Resp 16   Ht 1.753 m (5\' 9" )   Wt (!) 162.7 kg   LMP 01/16/2019   SpO2 96%   BMI 52.97 kg/m   Physical Exam Vitals and nursing note  reviewed.  Constitutional:      General: Meagan Hahn is not in acute distress.    Appearance: Meagan Hahn is well-developed.  HENT:     Head: Normocephalic and atraumatic.     Right Ear: External ear normal.     Left Ear: External ear normal.     Mouth/Throat:     Pharynx: No pharyngeal swelling or oropharyngeal exudate.  Eyes:     General: No scleral icterus.       Right eye: No discharge.        Left eye: No discharge.     Conjunctiva/sclera: Conjunctivae normal.  Neck:     Trachea: No tracheal deviation.  Cardiovascular:     Rate and Rhythm: Normal rate and regular rhythm.  Pulmonary:     Effort: Pulmonary effort is normal. No respiratory distress.     Breath sounds: Normal breath sounds. No stridor. No wheezing or rales.  Abdominal:     General: Bowel sounds are normal. There  is no distension.     Palpations: Abdomen is soft.     Tenderness: There is no abdominal tenderness. There is no guarding or rebound.  Musculoskeletal:        General: No tenderness.     Cervical back: Neck supple.  Skin:    General: Skin is warm and dry.     Findings: No rash.  Neurological:     Mental Status: Meagan Hahn is alert.     Cranial Nerves: No cranial nerve deficit (no facial droop, extraocular movements intact, no slurred speech).     Sensory: No sensory deficit.     Motor: No abnormal muscle tone or seizure activity.     Coordination: Coordination normal.     ED Results / Procedures / Treatments   Labs (all labs ordered are listed, but only abnormal results are displayed) Labs Reviewed - No data to display  EKG None  Radiology DG Chest [facility] 1 View  Result Date: 04/16/2019 CLINICAL DATA:  Shortness of breath and cough. EXAM: PORTABLE CHEST 1 VIEW COMPARISON:  None. FINDINGS: The heart size and mediastinal contours are within normal limits. Both lungs are clear. The visualized skeletal structures are unremarkable. IMPRESSION: Normal chest. Electronically Signed   By: Lorriane Shire M.D.   On:  04/16/2019 11:10    Procedures Procedures (including critical care time)  Medications Ordered in ED Medications - No data to display  ED Course  I have reviewed the triage vital signs and the nursing notes.  Pertinent labs & imaging results that were available during my care of the patient were reviewed by me and considered in my medical decision making (see chart for details).    MDM Rules/Calculators/A&P                      Meagan Hahn was evaluated in Emergency Department on 04/16/2019 for the symptoms described in the history of present illness. Meagan Hahn was evaluated in the context of the global COVID-19 pandemic, which necessitated consideration that the patient might be at risk for infection with the SARS-CoV-2 virus that causes COVID-19. Institutional protocols and algorithms that pertain to the evaluation of patients at risk for COVID-19 are in a state of rapid change based on information released by regulatory bodies including the CDC and federal and state organizations. These policies and algorithms were followed during the patient's care in the ED.  Patient had a negative Covid test yesterday  However her exposure was within the last couple days.  Is possible this is a false negative.  Patient is nontoxic and well-appearing.  Chest x-ray does not show pneumonia.  Still have concerns about possible Covid infection.  Recommend patient remain quarantined.  Consider repeat testing in a few days if her symptoms persist Final Clinical Impression(s) / ED Diagnoses Final diagnoses:  Suspected COVID-19 virus infection    Rx / DC Orders ED Discharge Orders    None       Dorie Rank, MD 04/16/19 1133    Dorie Rank, MD 04/16/19 1141

## 2019-07-30 ENCOUNTER — Encounter (HOSPITAL_BASED_OUTPATIENT_CLINIC_OR_DEPARTMENT_OTHER): Payer: Self-pay | Admitting: Emergency Medicine

## 2019-07-30 ENCOUNTER — Observation Stay (HOSPITAL_BASED_OUTPATIENT_CLINIC_OR_DEPARTMENT_OTHER)
Admission: EM | Admit: 2019-07-30 | Discharge: 2019-07-31 | Disposition: A | Discharge status: Home / Self Care | Payer: Federal, State, Local not specified - PPO | Attending: Internal Medicine | Admitting: Internal Medicine

## 2019-07-30 ENCOUNTER — Other Ambulatory Visit: Payer: Self-pay

## 2019-07-30 ENCOUNTER — Emergency Department (HOSPITAL_BASED_OUTPATIENT_CLINIC_OR_DEPARTMENT_OTHER): Payer: Federal, State, Local not specified - PPO

## 2019-07-30 DIAGNOSIS — I2699 Other pulmonary embolism without acute cor pulmonale: Secondary | ICD-10-CM | POA: Diagnosis not present

## 2019-07-30 DIAGNOSIS — I82452 Acute embolism and thrombosis of left peroneal vein: Secondary | ICD-10-CM

## 2019-07-30 DIAGNOSIS — R079 Chest pain, unspecified: Secondary | ICD-10-CM | POA: Diagnosis present

## 2019-07-30 DIAGNOSIS — Z20822 Contact with and (suspected) exposure to covid-19: Secondary | ICD-10-CM | POA: Diagnosis not present

## 2019-07-30 DIAGNOSIS — I10 Essential (primary) hypertension: Secondary | ICD-10-CM | POA: Diagnosis not present

## 2019-07-30 DIAGNOSIS — G4733 Obstructive sleep apnea (adult) (pediatric): Secondary | ICD-10-CM | POA: Diagnosis present

## 2019-07-30 DIAGNOSIS — G43909 Migraine, unspecified, not intractable, without status migrainosus: Secondary | ICD-10-CM | POA: Insufficient documentation

## 2019-07-30 DIAGNOSIS — I82432 Acute embolism and thrombosis of left popliteal vein: Secondary | ICD-10-CM

## 2019-07-30 HISTORY — DX: Essential (primary) hypertension: I10

## 2019-07-30 HISTORY — DX: Migraine, unspecified, not intractable, without status migrainosus: G43.909

## 2019-07-30 HISTORY — DX: Obstructive sleep apnea (adult) (pediatric): G47.33

## 2019-07-30 HISTORY — DX: Gastro-esophageal reflux disease without esophagitis: K21.9

## 2019-07-30 LAB — PROTIME-INR
INR: 1 (ref 0.8–1.2)
Prothrombin Time: 13.1 seconds (ref 11.4–15.2)

## 2019-07-30 LAB — PREGNANCY, URINE: Preg Test, Ur: NEGATIVE

## 2019-07-30 LAB — CBC WITH DIFFERENTIAL/PLATELET
Abs Immature Granulocytes: 0.02 10*3/uL (ref 0.00–0.07)
Basophils Absolute: 0 10*3/uL (ref 0.0–0.1)
Basophils Relative: 0 %
Eosinophils Absolute: 0.2 10*3/uL (ref 0.0–0.5)
Eosinophils Relative: 2 %
HCT: 36.3 % (ref 36.0–46.0)
Hemoglobin: 11.5 g/dL — ABNORMAL LOW (ref 12.0–15.0)
Immature Granulocytes: 0 %
Lymphocytes Relative: 28 %
Lymphs Abs: 2.5 10*3/uL (ref 0.7–4.0)
MCH: 27.8 pg (ref 26.0–34.0)
MCHC: 31.7 g/dL (ref 30.0–36.0)
MCV: 87.9 fL (ref 80.0–100.0)
Monocytes Absolute: 0.6 10*3/uL (ref 0.1–1.0)
Monocytes Relative: 7 %
Neutro Abs: 5.6 10*3/uL (ref 1.7–7.7)
Neutrophils Relative %: 63 %
Platelets: 193 10*3/uL (ref 150–400)
RBC: 4.13 MIL/uL (ref 3.87–5.11)
RDW: 14.1 % (ref 11.5–15.5)
WBC: 8.9 10*3/uL (ref 4.0–10.5)
nRBC: 0 % (ref 0.0–0.2)

## 2019-07-30 LAB — SARS CORONAVIRUS 2 BY RT PCR (HOSPITAL ORDER, PERFORMED IN ~~LOC~~ HOSPITAL LAB): SARS Coronavirus 2: NEGATIVE

## 2019-07-30 LAB — APTT: aPTT: 29 seconds (ref 24–36)

## 2019-07-30 MED ORDER — ONDANSETRON HCL 4 MG/2ML IJ SOLN: 4.0000 mg | Freq: Four times a day (QID) | INTRAMUSCULAR | Status: DC | PRN

## 2019-07-30 MED ORDER — LOSARTAN POTASSIUM 25 MG PO TABS
25.0000 mg | ORAL_TABLET | Freq: Every day | ORAL | Status: DC
  Administered 2019-07-31: 25 mg via ORAL
  Filled 2019-07-30: qty 1

## 2019-07-30 MED ORDER — ENOXAPARIN SODIUM 300 MG/3ML IJ SOLN
1.0000 mg/kg | Freq: Two times a day (BID) | INTRAMUSCULAR | Status: DC
  Administered 2019-07-31: 155 mg via SUBCUTANEOUS
  Filled 2019-07-30 (×3): qty 1.55

## 2019-07-30 MED ORDER — TOPIRAMATE 25 MG PO TABS
50.0000 mg | ORAL_TABLET | Freq: Every day | ORAL | Status: DC
  Administered 2019-07-30: 50 mg via ORAL
  Filled 2019-07-30: qty 2

## 2019-07-30 MED ORDER — ONDANSETRON HCL 4 MG PO TABS: 4.0000 mg | ORAL_TABLET | Freq: Four times a day (QID) | ORAL | Status: DC | PRN

## 2019-07-30 MED ORDER — IOHEXOL 350 MG/ML SOLN
100.0000 mL | Freq: Once | INTRAVENOUS | Status: AC | PRN
  Administered 2019-07-30: 100 mL via INTRAVENOUS

## 2019-07-30 MED ORDER — ENOXAPARIN SODIUM 150 MG/ML ~~LOC~~ SOLN
150.0000 mg | Freq: Once | SUBCUTANEOUS | Status: AC
  Administered 2019-07-30: 150 mg via SUBCUTANEOUS
  Filled 2019-07-30: qty 1

## 2019-07-30 MED ORDER — SODIUM CHLORIDE 0.9% FLUSH
3.0000 mL | Freq: Two times a day (BID) | INTRAVENOUS | Status: DC
  Administered 2019-07-30 – 2019-07-31 (×2): 3 mL via INTRAVENOUS

## 2019-07-30 MED ORDER — ACETAMINOPHEN 650 MG RE SUPP: 650.0000 mg | Freq: Four times a day (QID) | RECTAL | Status: DC | PRN

## 2019-07-30 MED ORDER — ACETAMINOPHEN 325 MG PO TABS: 650.0000 mg | ORAL_TABLET | Freq: Four times a day (QID) | ORAL | Status: DC | PRN

## 2019-07-30 NOTE — ED Notes (Signed)
ED Provider at bedside. 

## 2019-07-30 NOTE — ED Provider Notes (Signed)
[facility] Provider Note   CSN: [id number] Arrival date & time: 07/30/19  1232     History Chief Complaint  Patient presents with  . Chest Pain    Meagan Hahn is a 39 y.o. woman with history of OSA and GERD who presents to the ED from urgent care with chest pain and elevated D-dimer.  Patient reports she has had chest pain for the last month, but it has gradually intensified, especially in the last couple of days. The pain is present just to the left of her sternum and is sharp in nature. It does not worsen with inspiration or exertion. She also endorses shortness of breath. Reports she has been worked up for the pain over the course of the last month, and it was attributed to MSK pain. She also endorses dull cramping in her L lower leg. This lower leg pain does not worsen with ambulation. She reports it is tender to the touch. The pain denies any smoking history, is not on birth control pills, and has no personal or family history of cancer or hypercoagulability. She works as a Clinical biochemist. States she was told she is undergoing early menopause.  At the urgent care, her vitals were stable. D-dimer was 2390. CBC significant for hemoglobin of 11.6. Troponin < 3. TSH wnl. CMP unremarkable.   History reviewed. No pertinent past medical history.  Patient Active Problem List   Diagnosis Date Noted  . PE (pulmonary thromboembolism) ([place]) 07/30/2019    Past Surgical History:  Procedure Laterality Date  . CESAREAN SECTION    . CESAREAN SECTION       OB History    Gravida  3   Para  2   Term      Preterm      AB  1   Living        SAB      TAB      Ectopic      Multiple      Live Births              No family history on file.  Social History   Tobacco Use  . Smoking status: Never Smoker  . Smokeless tobacco: Never Used  Vaping Use  . Vaping Use: Never used  Substance Use Topics  . Alcohol use: Yes    Comment:  occasionally  . Drug use: No    Home Medications Prior to Admission medications   Medication Sig Start Date End Date Taking? Authorizing Provider  valACYclovir (VALTREX) 500 MG tablet Take 500 mg by mouth daily.    [provider]    Allergies    Ciprofloxacin  Review of Systems   Review of Systems  Constitutional: Negative for appetite change, chills and fever.  HENT: Negative for congestion.   Respiratory: Positive for chest tightness and shortness of breath.   Cardiovascular: Positive for chest pain. Negative for leg swelling.  Gastrointestinal: Negative for abdominal pain.  Genitourinary: Negative for dysuria.  Skin: Negative for rash.    Physical Exam Updated Vital Signs BP 140/83 (BP Location: Right Arm)   Pulse 74   Temp 98.5 F (36.9 C) (Oral)   Resp 17   Ht 5\' 10"  (1.778 m)   Wt (!) 155.1 kg   SpO2 100%   BMI 49.07 kg/m   Physical Exam Constitutional:      General: She is not in acute distress.    Appearance: She is well-developed. She is  not ill-appearing.  HENT:     Head: Normocephalic and atraumatic.  Eyes:     Extraocular Movements: Extraocular movements intact.  Cardiovascular:     Rate and Rhythm: Normal rate and regular rhythm.     Heart sounds: Normal heart sounds.  Pulmonary:     Effort: Pulmonary effort is normal.     Breath sounds: Normal breath sounds.  Chest:     Chest wall: Tenderness present.  Abdominal:     Palpations: Abdomen is soft.  Musculoskeletal:     Left lower leg: Tenderness present.     Comments: No erythema, swelling, or edema. L calf tender to palpation.  Skin:    General: Skin is warm and dry.  Neurological:     Mental Status: She is oriented to person, place, and time.     Motor: No weakness.     ED Results / Procedures / Treatments   Labs (all labs ordered are listed, but only abnormal results are displayed) Labs Reviewed  SARS CORONAVIRUS 2 BY RT PCR [facility] ORDER, [place] LAB)  PREGNANCY, URINE  CBC WITH DIFFERENTIAL/PLATELET  PROTIME-INR  APTT    EKG EKG Interpretation  Date/Time:  Thursday July 30 2019 12:40:14 EDT Ventricular Rate:  62 PR Interval:    QRS Duration: 95 QT Interval:  421 QTC Calculation: 428 R Axis:   51 Text Interpretation: Sinus rhythm No previous ECGs available Confirmed by Gareth Morgan [phone]) on 07/30/2019 12:56:45 PM   Radiology CT Angio Chest PE W and/or Wo Contrast  Result Date: 07/30/2019 CLINICAL DATA:  PE suspected, chest pain, shortness of breath EXAM: CT ANGIOGRAPHY CHEST WITH CONTRAST TECHNIQUE: Multidetector CT imaging of the chest was performed using the standard protocol during bolus administration of intravenous contrast. Multiplanar CT image reconstructions and MIPs were obtained to evaluate the vascular anatomy. CONTRAST:  12mL OMNIPAQUE IOHEXOL 350 MG/ML SOLN COMPARISON:  None. FINDINGS: Cardiovascular: Satisfactory opacification of the pulmonary arteries to the segmental level. Positive examination for pulmonary embolism with lobar to subsegmental embolus present throughout the right lung and segmental to subsegmental embolus present in the left lower lobe. The RV LV ratio is preserved, approximately 0.8. The main pulmonary artery is mildly enlarged measuring 3.1 cm. Normal heart size. No pericardial effusion. Mediastinum/Nodes: No enlarged mediastinal, hilar, or axillary lymph nodes. Thyroid gland, trachea, and esophagus demonstrate no significant findings. Lungs/Pleura: Lungs are clear. No pleural effusion or pneumothorax. Upper Abdomen: No acute abnormality. Musculoskeletal: No chest wall abnormality. No acute or significant osseous findings. Review of the MIP images confirms the above findings. IMPRESSION: 1. Positive examination for pulmonary embolism with lobar to subsegmental embolus present throughout the right lung and segmental to subsegmental embolus present in the left lower lobe. 2. No CT evidence  of right heart strain. 3. The main pulmonary artery is mildly enlarged measuring 3.1 cm, which can be seen in pulmonary hypertension. These results were called by telephone at the time of interpretation on 07/30/2019 at 3:24 pm to Dr. Gareth Morgan , who verbally acknowledged these results. Electronically Signed   By: Eddie Candle M.D.   On: 07/30/2019 15:27   US Venous Img Lower  Left (DVT Study)  Result Date: 07/30/2019 CLINICAL DATA:  Left knee pain EXAM: LEFT LOWER EXTREMITY VENOUS DOPPLER ULTRASOUND TECHNIQUE: Gray-scale sonography with graded compression, as well as color Doppler and duplex ultrasound were performed to evaluate the lower extremity deep venous systems from the level of the common femoral vein and including the  common femoral, femoral, profunda femoral, popliteal and calf veins including the posterior tibial, peroneal and gastrocnemius veins when visible. The superficial great saphenous vein was also interrogated. Spectral Doppler was utilized to evaluate flow at rest and with distal augmentation maneuvers in the common femoral, femoral and popliteal veins. COMPARISON:  None. FINDINGS: Contralateral Common Femoral Vein: Respiratory phasicity is normal and symmetric with the symptomatic side. No evidence of thrombus. Normal compressibility. Common Femoral Vein: No evidence of thrombus. Normal compressibility, respiratory phasicity and response to augmentation. Saphenofemoral Junction: No evidence of thrombus. Normal compressibility and flow on color Doppler imaging. Profunda Femoral Vein: No evidence of thrombus. Normal compressibility and flow on color Doppler imaging. Femoral Vein: No evidence of thrombus. Normal compressibility, respiratory phasicity and response to augmentation. Popliteal Vein: Hypoechoic intraluminal thrombus. Thrombus is nonocclusive. Vessel is partially compressible. Phasic flow remains. Calf Veins: Thrombus does appear to extend into the left peroneal calf vein  appearing occlusive. Vessel noncompressible. No phasic flow demonstrated. IMPRESSION: Positive exam for left popliteal DVT extending into the calf peroneal vein. Electronically Signed   By: Jerilynn Mages.  Shick M.D.   On: 07/30/2019 14:45    Procedures Procedures (including critical care time)  Medications Ordered in ED Medications  enoxaparin (LOVENOX) injection 150 mg (has no administration in time range)  enoxaparin (LOVENOX) 100 mg/mL injection 155 mg (has no administration in time range)  iohexol (OMNIPAQUE) 350 MG/ML injection 100 mL (100 mLs Intravenous Contrast Given 07/30/19 1422)    ED Course  I have reviewed the triage vital signs and the nursing notes.  Pertinent labs & imaging results that were available during my care of the patient were reviewed by me and considered in my medical decision making (see chart for details).  Clinical Course as of Jul 29 1617  Thu Jul 30, 2019  1604 US Venous Img Lower  Left (DVT Study) [JW]    Clinical Course User Index [JW] Alexandria Lodge, MD   MDM Rules/Calculators/A&P                         Meagan Hahn is a 39 y.o. woman with history of OSA and GERD who presents to the ED from urgent care with chest pain and elevated D-dimer.  Patient is well-appearing with stable vitals. The patient scores 3.0 points on the Wells' Criteria, and meets zero criteria for the [facility] Rule. However, given chest pain, elevated D-dimer, and concomitant L lower extremity cramping, will obtain CT angio chest PE protocol and left lower leg doppler.  3:30pm L lower extremity doppler significant for left popliteal DVT extending into the calf peroneal vein. CT angio positive for pulmonary embolism with lobar to subsegmental embolus present throughout the R lung and segmental to subsegmental embolus present in the left lower lobe, with no CT evidence of R heart strain. The main pulmonary artery is mildly enlarged. Pulmonary Embolism Severity Index (PESI) score is class I, or very  low risk. Also low risk by the [place] Criteria. Patient could potentially be discharged home on anticoagulation, however given the patient's concomitant DVT, will discuss possible admission. Patient would prefer to be discharged home.  3:50pm Case discussed with hospitalists at [facility]. Patient will be transferred to [facility] and admitted for observation and further work-up.   Final Clinical Impression(s) / ED Diagnoses Final diagnoses:  Pulmonary embolism, unspecified chronicity, unspecified pulmonary embolism type, unspecified whether acute cor pulmonale present (HCC)  Deep venous thrombosis (DVT) of left peroneal vein, unspecified chronicity (  [facility])    Rx / [place] Orders ED Discharge Orders    None       Alexandria Lodge, MD 07/30/19 2979    Gareth Morgan, MD 07/30/19 [phone]

## 2019-07-30 NOTE — ED Notes (Signed)
Lab called and blood drawn to hold is hemolyzed and will need to be recollected if test are ordered.

## 2019-07-30 NOTE — ED Notes (Signed)
3:40 called [facility] spoke with doug for consult with hospitalist

## 2019-07-30 NOTE — ED Triage Notes (Addendum)
Chest pain x 1 month. Worsening today so she went to the dr who sent her here. She has been evaluated before and dx with musculoskeletal pain. States the office did blood work and told her to come to the ED because something was abnormal but she is not sure what.

## 2019-07-30 NOTE — Progress Notes (Signed)
ANTICOAGULATION CONSULT NOTE - Initial Consult  Pharmacy Consult for enoxaparin Indication: pulmonary embolus and DVT  Allergies  Allergen Reactions  . Ciprofloxacin Nausea And Vomiting    Patient Measurements: Height: 5\' 10"  (177.8 cm) Weight: (!) 155.1 kg (342 lb) IBW/kg (Calculated) : 68.5 Enoxaparin Dosing Weight: 150 kg  Vital Signs: Temp: 98.5 F (36.9 C) (07/08 1239) Temp Source: Oral (07/08 1239) BP: 140/83 (07/08 1239) Pulse Rate: 74 (07/08 1239)  Labs: No results for input(s): HGB, HCT, PLT, APTT, LABPROT, INR, HEPARINUNFRC, HEPRLOWMOCWT, CREATININE, CKTOTAL, CKMB, TROPONINIHS in the last 72 hours.  CrCl cannot be calculated (Patient's most recent lab result is older than the maximum 21 days allowed.).   Medical History: History reviewed. No pertinent past medical history.  Medications:  Scheduled:  . [START ON 07/31/2019] enoxaparin (LOVENOX) injection  1 mg/kg Subcutaneous Q12H  . enoxaparin (LOVENOX) injection  150 mg Subcutaneous Once    Assessment:  Patient with CP for several days, presents with bilateral PE  Goal of Therapy:  Anti-Xa level 0.6-1 units/ml 4hrs after LMWH dose given Monitor platelets by anticoagulation protocol: Yes   Plan:  Lovenox  150 mg for first dose, then 155 mg every 12 hours   Mallie Mussel A Jep Dyas 07/30/2019,4:07 PM

## 2019-07-30 NOTE — ED Notes (Signed)
Her D dimer was 2,390 at her UC visit today. She was told to come here for evaluation. Her oxygen is 100%. She is speaking in complete sentences. States she feels SOB.

## 2019-07-30 NOTE — ED Notes (Signed)
To bathroom with steady gait.

## 2019-07-30 NOTE — H&P (Signed)
History and Physical    Meagan Hahn GYF:[id number] DOB: November 07, 1980 DOA: 07/30/2019  PCP: Henderson Baltimore, FNP  Patient coming from: [facility] ED  I have personally briefly reviewed patient's old medical records in [place]  Chief Complaint: Chest pain  HPI: Meagan Hahn is a 39 y.o. female with medical history significant for hypertension, GERD, migraines, obesity, and OSA who initially presented to [facility] urgent care in [facility] for evaluation of chest pain.    Patient states she has been having 1 month of cramping pain in her left lower extremity as well as cramping upper chest pain.  She says she has been getting short of breath when exerting herself for long periods of time but not when at rest or with minimal activity.  She has been having a dry cough and denies any hemoptysis.  She denies any palpitations, nausea, vomiting, subjective fevers, chills, diaphoresis, abdominal pain, dysuria.  She denies any use of oral contraceptives or estrogen containing products.  She denies any recent surgeries.  She denies any history of tobacco use.  She denies any recent travel.  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  She denies any personal history of blood clots or known history of blood clots in her immediate family.  She denies any obvious bleeding.  At [facility] urgent care pain was initially felt to be musculoskeletal however lab work revealed an elevated D-dimer of 2390.  She was called and advised to present to the ED for emergent evaluation.    Other labs obtained are notable for WBC 6.0, hemoglobin 11.6, platelets 191,000,Sodium 141, potassium 3.7, bicarb 26, BUN 20, creatinine 0.84, serum glucose 100, LFTs within normal limits, TSH 2.027, troponin I <3.  [place] [facility] ED Course:  Initial vitals showed BP 140/83, pulse 74, RR 17, temp 98.5 Fahrenheit, SPO2 100% on room air.  Repeat CBC was not  significantly changed.  INR is 1.0, urine pregnancy test negative.  SARS-CoV-2 PCR negative.  Left lower extremity venous Doppler ultrasound was positive for left popliteal DVT extending into the calf peroneal vein.  CTA chest PE study revealed pulmonary embolism with lobar to subsegmental embolus present throughout the right lung and segmental to subsegmental embolus present in the left lower lobe.  There was no CT evidence of right heart strain.  Main pulmonary artery noted to be mildly enlarged measuring 3.1 cm.  Patient was started on full dose anticoagulation with Lovenox per pharmacy.  The hospitalist service was consulted for transfer and admission for further evaluation and management.  Review of Systems: All systems reviewed and are negative except as documented in history of present illness above.   Past Medical History:  Diagnosis Date   GERD (gastroesophageal reflux disease)    Hypertension    Migraines    OSA (obstructive sleep apnea)     Past Surgical History:  Procedure Laterality Date   CESAREAN SECTION     CESAREAN SECTION      Social History:  reports that she has never smoked. She has never used smokeless tobacco. She reports current alcohol use. She reports that she does not use drugs.  Allergies  Allergen Reactions   Ciprofloxacin Nausea And Vomiting    Family History  Problem Relation Age of Onset   Migraines Mother    Stroke Maternal Grandmother      Prior to Admission medications   Medication Sig Start Date End  Date Taking? Authorizing Provider  losartan (COZAAR) 25 MG tablet Take 25 mg by mouth daily. 06/23/19  Yes [provider]  meloxicam (MOBIC) 15 MG tablet Take 15 mg by mouth daily. 07/16/19  Yes [provider]  omeprazole (PRILOSEC) 20 MG capsule Take 20 mg by mouth as needed (acid reflux).  06/23/19  Yes [provider]  topiramate (TOPAMAX) 25 MG tablet Take 50 mg by mouth at bedtime. INCREASE BY 1 WEEKLY  TO 4 TABS AT BEDTIME 04/08/19  Yes [provider]  valACYclovir (VALTREX) 500 MG tablet Take 500 mg by mouth daily.   Yes [provider]    Physical Exam: Vitals:   07/30/19 2030 07/30/19 2034 07/30/19 2049 07/30/19 2125  BP: (!) 113/56 128/69  128/72  Pulse: 73 84  70  Resp: 17 (!) 21  17  Temp:   98.1 F (36.7 C) 98.3 F (36.8 C)  TempSrc:   Oral Oral  SpO2: 97% 99%  99%  Weight:    (!) 157 kg  Height:    5\' 9"  (1.753 m)   Constitutional: Obese woman resting in bed with head elevated, NAD, calm, comfortable Eyes: PERRL, lids and conjunctivae normal ENMT: Mucous membranes are moist. Posterior pharynx clear of any exudate or lesions.Normal dentition.  Neck: normal, supple, no masses. Respiratory: clear to auscultation bilaterally, no wheezing, no crackles. Normal respiratory effort. No accessory muscle use.  Cardiovascular: Regular rate and rhythm, no murmurs / rubs / gallops. No extremity edema. 2+ pedal pulses. Abdomen: no tenderness, no masses palpated. No hepatosplenomegaly. Bowel sounds positive.  Musculoskeletal: no clubbing / cyanosis. No joint deformity upper and lower extremities. Good ROM, no contractures. Normal muscle tone.  Skin: no rashes, lesions, ulcers. No induration Neurologic: CN 2-12 grossly intact. Sensation intact, Strength 5/5 in all 4.  Psychiatric: Normal judgment and insight. Alert and oriented x 3. Normal mood.   Labs on Admission: I have personally reviewed following labs and imaging studies  CBC: Recent Labs  Lab 07/30/19 1628  WBC 8.9  NEUTROABS 5.6  HGB 11.5*  HCT 36.3  MCV 87.9  PLT 397   Basic Metabolic Panel: No results for input(s): NA, K, CL, CO2, GLUCOSE, BUN, CREATININE, CALCIUM, MG, PHOS in the last 168 hours. GFR: CrCl cannot be calculated (Patient's most recent lab result is older than the maximum 21 days allowed.). Liver Function Tests: No results for input(s): AST, ALT, ALKPHOS, BILITOT, PROT, ALBUMIN in the  last 168 hours. No results for input(s): LIPASE, AMYLASE in the last 168 hours. No results for input(s): AMMONIA in the last 168 hours. Coagulation Profile: Recent Labs  Lab 07/30/19 1628  INR 1.0   Cardiac Enzymes: No results for input(s): CKTOTAL, CKMB, CKMBINDEX, TROPONINI in the last 168 hours. BNP (last 3 results) No results for input(s): PROBNP in the last 8760 hours. HbA1C: No results for input(s): HGBA1C in the last 72 hours. CBG: No results for input(s): GLUCAP in the last 168 hours. Lipid Profile: No results for input(s): CHOL, HDL, LDLCALC, TRIG, CHOLHDL, LDLDIRECT in the last 72 hours. Thyroid Function Tests: No results for input(s): TSH, T4TOTAL, FREET4, T3FREE, THYROIDAB in the last 72 hours. Anemia Panel: No results for input(s): VITAMINB12, FOLATE, FERRITIN, TIBC, IRON, RETICCTPCT in the last 72 hours. Urine analysis:    Component Value Date/Time   COLORURINE YELLOW 06/08/2017 1059   APPEARANCEUR CLOUDY (A) 06/08/2017 1059   LABSPEC 1.020 06/08/2017 1059   PHURINE 7.5 06/08/2017 1059   GLUCOSEU NEGATIVE 06/08/2017 1059  HGBUR NEGATIVE 06/08/2017 1059   [place] 06/08/2017 [place] 06/08/2017 1059   PROTEINUR NEGATIVE 06/08/2017 1059   UROBILINOGEN 1.0 12/01/2010 1351   NITRITE NEGATIVE 06/08/2017 1059   LEUKOCYTESUR NEGATIVE 06/08/2017 1059    Radiological Exams on Admission: CT Angio Chest PE W and/or Wo Contrast  Result Date: 07/30/2019 CLINICAL DATA:  PE suspected, chest pain, shortness of breath EXAM: CT ANGIOGRAPHY CHEST WITH CONTRAST TECHNIQUE: Multidetector CT imaging of the chest was performed using the standard protocol during bolus administration of intravenous contrast. Multiplanar CT image reconstructions and MIPs were obtained to evaluate the vascular anatomy. CONTRAST:  114mL OMNIPAQUE IOHEXOL 350 MG/ML SOLN COMPARISON:  None. FINDINGS: Cardiovascular: Satisfactory opacification of the pulmonary arteries to the  segmental level. Positive examination for pulmonary embolism with lobar to subsegmental embolus present throughout the right lung and segmental to subsegmental embolus present in the left lower lobe. The RV LV ratio is preserved, approximately 0.8. The main pulmonary artery is mildly enlarged measuring 3.1 cm. Normal heart size. No pericardial effusion. Mediastinum/Nodes: No enlarged mediastinal, hilar, or axillary lymph nodes. Thyroid gland, trachea, and esophagus demonstrate no significant findings. Lungs/Pleura: Lungs are clear. No pleural effusion or pneumothorax. Upper Abdomen: No acute abnormality. Musculoskeletal: No chest wall abnormality. No acute or significant osseous findings. Review of the MIP images confirms the above findings. IMPRESSION: 1. Positive examination for pulmonary embolism with lobar to subsegmental embolus present throughout the right lung and segmental to subsegmental embolus present in the left lower lobe. 2. No CT evidence of right heart strain. 3. The main pulmonary artery is mildly enlarged measuring 3.1 cm, which can be seen in pulmonary hypertension. These results were called by telephone at the time of interpretation on 07/30/2019 at 3:24 pm to Dr. Gareth Morgan , who verbally acknowledged these results. Electronically Signed   By: Eddie Candle M.D.   On: 07/30/2019 15:27   US Venous Img Lower  Left (DVT Study)  Result Date: 07/30/2019 CLINICAL DATA:  Left knee pain EXAM: LEFT LOWER EXTREMITY VENOUS DOPPLER ULTRASOUND TECHNIQUE: Gray-scale sonography with graded compression, as well as color Doppler and duplex ultrasound were performed to evaluate the lower extremity deep venous systems from the level of the common femoral vein and including the common femoral, femoral, profunda femoral, popliteal and calf veins including the posterior tibial, peroneal and gastrocnemius veins when visible. The superficial great saphenous vein was also interrogated. Spectral Doppler was utilized  to evaluate flow at rest and with distal augmentation maneuvers in the common femoral, femoral and popliteal veins. COMPARISON:  None. FINDINGS: Contralateral Common Femoral Vein: Respiratory phasicity is normal and symmetric with the symptomatic side. No evidence of thrombus. Normal compressibility. Common Femoral Vein: No evidence of thrombus. Normal compressibility, respiratory phasicity and response to augmentation. Saphenofemoral Junction: No evidence of thrombus. Normal compressibility and flow on color Doppler imaging. Profunda Femoral Vein: No evidence of thrombus. Normal compressibility and flow on color Doppler imaging. Femoral Vein: No evidence of thrombus. Normal compressibility, respiratory phasicity and response to augmentation. Popliteal Vein: Hypoechoic intraluminal thrombus. Thrombus is nonocclusive. Vessel is partially compressible. Phasic flow remains. Calf Veins: Thrombus does appear to extend into the left peroneal calf vein appearing occlusive. Vessel noncompressible. No phasic flow demonstrated. IMPRESSION: Positive exam for left popliteal DVT extending into the calf peroneal vein. Electronically Signed   By: Jerilynn Mages.  Shick M.D.   On: 07/30/2019 14:45    EKG: Independently reviewed. Normal sinus rhythm without acute ischemic changes.  No prior  for comparison.  Assessment/Plan Principal Problem:   Bilateral pulmonary embolism (HCC) Active Problems:   Acute deep vein thrombosis (DVT) of popliteal vein of left lower extremity (HCC)   OSA (obstructive sleep apnea)   Migraines   Hypertension  Meagan Hahn is a 39 y.o. female with medical history significant for hypertension, GERD, migraines, obesity, and OSA who is admitted with bilateral pulmonary embolism with left lower extremity DVT.  Bilateral pulmonary embolism with left lower extremity popliteal vein DVT: Appears to be unprovoked DVT with bilateral PE.  CT imaging shows possible pulmonary hypertension without evidence of right  heart strain.  She has been started on Lovenox full dose anticoagulation.  She is currently hemodynamically stable. -Obtain echocardiogram -Continue Lovenox AC, can likely transition to [place] on discharge -Supplemental oxygen if needed  Hypertension: Continue losartan.  Migraines: Continue Topamax.  OSA: Not requiring CPAP for patient.  Can use nocturnal supplemental oxygen if needed.  DVT prophylaxis: Lovenox anticoagulation Code Status: Full code, confirmed with patient Family Communication: Discussed with patient, she has discussed with family Disposition Plan: From home and likely discharge to home in 1 day Consults called: None Admission status:  Status is: Observation  The patient remains OBS appropriate and will d/c before 2 midnights.  Dispo: The patient is from: Home              Anticipated d/c is to: Home              Anticipated d/c date is: 1 day pending echocardiogram results              Patient currently is not medically stable to d/c.   Zada Finders MD [facility]  If 7PM-7AM, please contact night-coverage [email or web address]  07/30/2019, 9:51 PM

## 2019-07-31 ENCOUNTER — Observation Stay (HOSPITAL_BASED_OUTPATIENT_CLINIC_OR_DEPARTMENT_OTHER): Payer: Federal, State, Local not specified - PPO

## 2019-07-31 DIAGNOSIS — I2693 Single subsegmental pulmonary embolism without acute cor pulmonale: Secondary | ICD-10-CM | POA: Diagnosis not present

## 2019-07-31 DIAGNOSIS — I2699 Other pulmonary embolism without acute cor pulmonale: Secondary | ICD-10-CM | POA: Diagnosis not present

## 2019-07-31 LAB — ECHOCARDIOGRAM COMPLETE
Height: 69 in
Weight: 5537.96 oz

## 2019-07-31 LAB — BASIC METABOLIC PANEL
Anion gap: 7 (ref 5–15)
BUN: 14 mg/dL (ref 6–20)
CO2: 27 mmol/L (ref 22–32)
Calcium: 9.1 mg/dL (ref 8.9–10.3)
Chloride: 107 mmol/L (ref 98–111)
Creatinine, Ser: 0.85 mg/dL (ref 0.44–1.00)
GFR calc Af Amer: >60 mL/min (ref >60)
GFR calc non Af Amer: >60 mL/min (ref >60)
Glucose, Bld: 93 mg/dL (ref 70–99)
Potassium: 4.3 mmol/L (ref 3.5–5.1)
Sodium: 141 mmol/L (ref 135–145)

## 2019-07-31 LAB — CBC
HCT: 32.9 % — ABNORMAL LOW (ref 36.0–46.0)
Hemoglobin: 10.6 g/dL — ABNORMAL LOW (ref 12.0–15.0)
MCH: 28.6 pg (ref 26.0–34.0)
MCHC: 32.2 g/dL (ref 30.0–36.0)
MCV: 88.7 fL (ref 80.0–100.0)
Platelets: 192 10*3/uL (ref 150–400)
RBC: 3.71 MIL/uL — ABNORMAL LOW (ref 3.87–5.11)
RDW: 14.1 % (ref 11.5–15.5)
WBC: 6 10*3/uL (ref 4.0–10.5)
nRBC: 0 % (ref 0.0–0.2)

## 2019-07-31 LAB — HIV ANTIBODY (ROUTINE TESTING W REFLEX): HIV Screen 4th Generation wRfx: NONREACTIVE

## 2019-07-31 MED ORDER — APIXABAN 5 MG PO TABS: 5.0000 mg | ORAL_TABLET | Freq: Two times a day (BID) | ORAL | Status: DC

## 2019-07-31 MED ORDER — APIXABAN 5 MG PO TABS: 5.0000 mg | ORAL_TABLET | Freq: Two times a day (BID) | ORAL | 1 refills | Status: AC

## 2019-07-31 MED ORDER — APIXABAN 5 MG PO TABS: 10.0000 mg | ORAL_TABLET | Freq: Two times a day (BID) | ORAL | Status: DC

## 2019-07-31 MED ORDER — APIXABAN 5 MG PO TABS: 10.0000 mg | ORAL_TABLET | Freq: Two times a day (BID) | ORAL | 0 refills | Status: AC

## 2019-07-31 NOTE — Discharge Instructions (Signed)
Information on my medicine - ELIQUIS (apixaban)  This medication education was reviewed with me or my healthcare representative as part of my discharge preparation.   Why was Eliquis prescribed for you? Eliquis was prescribed to treat blood clots that may have been found in the veins of your legs (deep vein thrombosis) or in your lungs (pulmonary embolism) and to reduce the risk of them occurring again.  What do You need to know about Eliquis ? The starting dose is 10 mg (two 5 mg tablets) taken TWICE daily for the FIRST SEVEN (7) DAYS, then on (enter date)  08/05/19 PM  the dose is reduced to ONE 5 mg tablet taken TWICE daily.  Eliquis may be taken with or without food.   Try to take the dose about the same time in the morning and in the evening. If you have difficulty swallowing the tablet whole please discuss with your pharmacist how to take the medication safely.  Take Eliquis exactly as prescribed and DO NOT stop taking Eliquis without talking to the doctor who prescribed the medication.  Stopping may increase your risk of developing a new blood clot.  Refill your prescription before you run out.  After discharge, you should have regular check-up appointments with your healthcare provider that is prescribing your Eliquis.    What do you do if you miss a dose? If a dose of ELIQUIS is not taken at the scheduled time, take it as soon as possible on the same day and twice-daily administration should be resumed. The dose should not be doubled to make up for a missed dose.  Important Safety Information A possible side effect of Eliquis is bleeding. You should call your healthcare provider right away if you experience any of the following: ? Bleeding from an injury or your nose that does not stop. ? Unusual colored urine (red or dark brown) or unusual colored stools (red or black). ? Unusual bruising for unknown reasons. ? A serious fall or if you hit your head (even if there is no  bleeding).  Some medicines may interact with Eliquis and might increase your risk of bleeding or clotting while on Eliquis. To help avoid this, consult your healthcare provider or pharmacist prior to using any new prescription or non-prescription medications, including herbals, vitamins, non-steroidal anti-inflammatory drugs (NSAIDs) and supplements.  This website has more information on Eliquis (apixaban): http://[email or web address].com/eliquis/home

## 2019-07-31 NOTE — Progress Notes (Signed)
ANTICOAGULATION CONSULT NOTE  Pharmacy Consult:  Lovenox >> Eliquis Indication: pulmonary embolus and DVT  Allergies  Allergen Reactions   Ciprofloxacin Nausea And Vomiting    Patient Measurements: Height: 5\' 9"  (175.3 cm) Weight: (!) 157 kg (346 lb 2 oz) IBW/kg (Calculated) : 66.2  Vital Signs: Temp: 98.1 F (36.7 C) (07/09 0727) BP: 122/86 (07/09 0727) Pulse Rate: 56 (07/09 0727)  Labs: Recent Labs    07/30/19 1628 07/31/19 0353  HGB 11.5* 10.6*  HCT 36.3 32.9*  PLT 193 192  APTT 29  --   LABPROT 13.1  --   INR 1.0  --   CREATININE  --  0.85    Estimated Creatinine Clearance: 148.1 mL/min (by C-G formula based on SCr of 0.85 mg/dL).  Assessment: 56 YOF presented with chest pain and found to have bilateral PEs and LLE DVT.  Pharmacy consulted to transition patient from Lovenox to Eliquis.  CBC stable; no bleeding reported.  Goal of Therapy:  Appropriate anticoagulation Monitor platelets by anticoagulation protocol: Yes   Plan:  Eliquis 10mg  PO BID x 7 days, then on 7/14 PM start 5mg  PO BID Pharmacy will sign off and follow peripherally.  Thank you for the consult!  Gaylynn Seiple D. Mina Marble, PharmD, BCPS, [place] 07/31/2019, 10:17 AM

## 2019-07-31 NOTE — Progress Notes (Signed)
PT Cancellation Note  Patient Details Name: Meagan Hahn MRN: [id number] DOB: 01-03-1981   Cancelled Treatment:    Reason Eval/Treat Not Completed: Patient at procedure or test/unavailable;Other (comment). Per department protocol for acute DVT/PE, must wait 24 hrs after initiation of anticoagulation prior to initiating evaluation. Pt's anticoagulation was initiated at 16:30 on 7/8. Will f/u later today.   [place] 07/31/2019, 12:00 PM

## 2019-07-31 NOTE — Progress Notes (Signed)
CSW provided Eliquis cards to patient.  No other needs at this time.  Laveda Abbe, [place] Clinical Social Worker [phone]

## 2019-07-31 NOTE — Evaluation (Signed)
Physical Therapy Evaluation Patient Details Name: Meagan Hahn MRN: [id number] DOB: December 29, 1980 Today's Date: 07/31/2019   History of Present Illness  Pt is a 39 y/o female admitted secondary to SOB and chest pain. Pt found to have bilateral pulmonary embolism with left lower extremity popliteal vein DVT. She was started on Lovenox for anticoagulation. PMH including but not limited to HTN and migraines.    Clinical Impression  Pt presented supine in bed with HOB elevated, awake and willing to participate in therapy session. Prior to admission, pt reported that she was independent with all functional mobility and ADLs. Pt lives with her teenage children in a two level townhouse with a level entry. At the time of evaluation, pt overall moving very well without any chest pain or SOB. Pt able to ambulate in hallway with supervision without the need for an AD. Additionally, pt able to participate in stair training without difficulties. No further acute PT needs identified at this time. PT signing off.     Follow Up Recommendations No PT follow up    Equipment Recommendations  None recommended by PT    Recommendations for Other Services       Precautions / Restrictions Precautions Precautions: None Restrictions Weight Bearing Restrictions: No      Mobility  Bed Mobility Overal bed mobility: Independent                Transfers Overall transfer level: Independent                  Ambulation/Gait Ambulation/Gait assistance: Supervision Gait Distance (Feet): 300 Feet Assistive device: None Gait Pattern/deviations: Step-through pattern Gait velocity: WFL   General Gait Details: no instability or LOB, no need for physical assistance or an AD  Stairs Stairs: Yes Stairs assistance: Supervision Stair Management: One rail Right;Alternating pattern;Forwards Number of Stairs: 10 General stair comments: pt able to perform without difficulties  Wheelchair Mobility     Modified Rankin (Stroke Patients Only)       Balance Overall balance assessment: Needs assistance Sitting-balance support: Feet supported Sitting balance-Leahy Scale: Good     Standing balance support: During functional activity Standing balance-Leahy Scale: Good                               Pertinent Vitals/Pain Pain Assessment: No/denies pain    Home Living Family/patient expects to be discharged to:: Private residence Living Arrangements: Children Available Help at Discharge: Family Type of Home: Other(Comment) (townhouse) Home Access: Level entry     Home Layout: Two level Home Equipment: None      Prior Function Level of Independence: Independent               Hand Dominance        Extremity/Trunk Assessment   Upper Extremity Assessment Upper Extremity Assessment: Overall WFL for tasks assessed    Lower Extremity Assessment Lower Extremity Assessment: Overall WFL for tasks assessed    Cervical / Trunk Assessment Cervical / Trunk Assessment: Normal  Communication   Communication: No difficulties  Cognition Arousal/Alertness: Awake/alert Behavior During Therapy: WFL for tasks assessed/performed Overall Cognitive Status: Within Functional Limits for tasks assessed                                        General Comments      Exercises  Assessment/Plan    PT Assessment Patent does not need any further PT services  PT Problem List         PT Treatment Interventions      PT Goals (Current goals can be found in the Care Plan section)  Acute Rehab PT Goals Patient Stated Goal: "home ASAP" PT Goal Formulation: All assessment and education complete, DC therapy    Frequency     Barriers to discharge        Co-evaluation               AM-PAC PT "6 Clicks" Mobility  Outcome Measure Help needed turning from your back to your side while in a flat bed without using bedrails?: None Help needed  moving from lying on your back to sitting on the side of a flat bed without using bedrails?: None Help needed moving to and from a bed to a chair (including a wheelchair)?: None Help needed standing up from a chair using your arms (e.g., wheelchair or bedside chair)?: None Help needed to walk in hospital room?: None Help needed climbing 3-5 steps with a railing? : None 6 Click Score: 24    End of Session   Activity Tolerance: Patient tolerated treatment well Patient left: in bed;with call bell/phone within reach;with family/visitor present Nurse Communication: Mobility status PT Visit Diagnosis: Other abnormalities of gait and mobility (R26.89)    Time: 1320-1330 PT Time Calculation (min) (ACUTE ONLY): 10 min   Charges:   PT Evaluation $PT Eval Low Complexity: 1 Low          Eduard Clos, PT, DPT  Acute Rehabilitation Services Pager [phone] Office [place] 07/31/2019, 2:49 PM

## 2019-07-31 NOTE — Progress Notes (Signed)
  Echocardiogram 2D Echocardiogram has been performed.  Jennette Dubin 07/31/2019, 11:52 AM

## 2019-07-31 NOTE — Discharge Summary (Signed)
Physician Discharge Summary  Meagan Hahn MWU:[id number] DOB: 18-Dec-1980 DOA: 07/30/2019  PCP: Henderson Baltimore, FNP  Admit date: 07/30/2019 Discharge date: 07/31/2019  Admitted From: Home Disposition:  Home  Discharge Condition:Stable CODE STATUS:FULL Diet recommendation: Heart Healthy  Brief/Interim Summary:  Meagan Hahn is a 39 y.o. female with medical history significant for hypertension, GERD, migraines, obesity, and OSA who initially presented to [facility] urgent care in [facility] for evaluation of chest pain.   She was found to have elevated D-dimer of 2390.  She was advised to come to the emergency department for further evaluation.  On presentation ,she was hemodynamically stable.  Left lower extremity Doppler study was positive for DVT.  CT chest showed pulmonary embolism with lobar to subsegmental embolus throughout the right lung and segmental to subsegmental embolus in the left lower lobe.  No CT evidence of heart strain. Echocardiogram showed normal ejection fraction.PT did not recommend any follow-up. Patient is hemodynamically stable for discharge to [facility].  Most likely she will need lifelong anticoagulation.  I have recommended her to make a close follow-up with her PCP.  Following problems were addressed during her hospitalization:  Discharge Diagnoses:  Principal Problem:   Bilateral pulmonary embolism ([facility]) Active Problems:   Acute deep vein thrombosis (DVT) of popliteal vein of left lower extremity (HCC)   OSA (obstructive sleep apnea)   Migraines   Hypertension  Bilateral pulmonary embolism with left lower extremity popliteal vein DVT: Appears to be unprovoked DVT with bilateral PE.  CT imaging shows possible pulmonary hypertension without evidence of right heart strain.  She was started on Lovenox full dose anticoagulation.  She is currently hemodynamically stable. Currently on room air.  Started on Eliquis.  Echo showed ejection fraction of 65-60%,  normal right ventricle function. She denies any dyspnea, chest pain.  Hypertension: Continue losartan.  Blood pressure stable  Migraines: Continue Topamax.  Suspected OSA: Not on CPAP.  We recommend to follow-up for sleep study as an outpatient.  Discharge Instructions  Discharge Instructions    Diet - low sodium heart healthy   Complete by: As directed    Discharge instructions   Complete by: As directed    1)Please follow up with your PCP in a week. 2)Take prescribed medications as instructed. 3)Please do regular exercise and take healthy diet.Make an appointment and [facility].   Increase activity slowly   Complete by: As directed      Allergies as of 07/31/2019      Reactions   Ciprofloxacin Nausea And Vomiting      Medication List    TAKE these medications   apixaban 5 MG Tabs tablet Commonly known as: ELIQUIS Take 2 tablets (10 mg total) by mouth 2 (two) times daily.   apixaban 5 MG Tabs tablet Commonly known as: ELIQUIS Take 1 tablet (5 mg total) by mouth 2 (two) times daily. Start taking on: August 07, 2019   losartan 25 MG tablet Commonly known as: COZAAR Take 25 mg by mouth daily.   meloxicam 15 MG tablet Commonly known as: MOBIC Take 15 mg by mouth daily.   omeprazole 20 MG capsule Commonly known as: PRILOSEC Take 20 mg by mouth as needed (acid reflux).   topiramate 25 MG tablet Commonly known as: TOPAMAX Take 50 mg by mouth at bedtime. INCREASE BY 1 WEEKLY TO 4 TABS AT BEDTIME   valACYclovir 500 MG tablet Commonly known as: VALTREX Take 500 mg by mouth daily.  Follow-up Information    Cousins, Gracy Bruins, FNP. Schedule an appointment as soon as possible for a visit in 1 week(s).   Specialty: Nurse Practitioner Contact information: [address] [place] [address] [phone]              Allergies  Allergen Reactions  . Ciprofloxacin Nausea And Vomiting     Consultations:  None   Procedures/Studies: CT Angio Chest PE W and/or Wo Contrast  Result Date: 07/30/2019 CLINICAL DATA:  PE suspected, chest pain, shortness of breath EXAM: CT ANGIOGRAPHY CHEST WITH CONTRAST TECHNIQUE: Multidetector CT imaging of the chest was performed using the standard protocol during bolus administration of intravenous contrast. Multiplanar CT image reconstructions and MIPs were obtained to evaluate the vascular anatomy. CONTRAST:  168mL OMNIPAQUE IOHEXOL 350 MG/ML SOLN COMPARISON:  None. FINDINGS: Cardiovascular: Satisfactory opacification of the pulmonary arteries to the segmental level. Positive examination for pulmonary embolism with lobar to subsegmental embolus present throughout the right lung and segmental to subsegmental embolus present in the left lower lobe. The RV LV ratio is preserved, approximately 0.8. The main pulmonary artery is mildly enlarged measuring 3.1 cm. Normal heart size. No pericardial effusion. Mediastinum/Nodes: No enlarged mediastinal, hilar, or axillary lymph nodes. Thyroid gland, trachea, and esophagus demonstrate no significant findings. Lungs/Pleura: Lungs are clear. No pleural effusion or pneumothorax. Upper Abdomen: No acute abnormality. Musculoskeletal: No chest wall abnormality. No acute or significant osseous findings. Review of the MIP images confirms the above findings. IMPRESSION: 1. Positive examination for pulmonary embolism with lobar to subsegmental embolus present throughout the right lung and segmental to subsegmental embolus present in the left lower lobe. 2. No CT evidence of right heart strain. 3. The main pulmonary artery is mildly enlarged measuring 3.1 cm, which can be seen in pulmonary hypertension. These results were called by telephone at the time of interpretation on 07/30/2019 at 3:24 pm to Dr. Gareth Morgan , who verbally acknowledged these results. Electronically Signed   By: Eddie Candle M.D.   On: 07/30/2019 15:27    US Venous Img Lower  Left (DVT Study)  Result Date: 07/30/2019 CLINICAL DATA:  Left knee pain EXAM: LEFT LOWER EXTREMITY VENOUS DOPPLER ULTRASOUND TECHNIQUE: Gray-scale sonography with graded compression, as well as color Doppler and duplex ultrasound were performed to evaluate the lower extremity deep venous systems from the level of the common femoral vein and including the common femoral, femoral, profunda femoral, popliteal and calf veins including the posterior tibial, peroneal and gastrocnemius veins when visible. The superficial great saphenous vein was also interrogated. Spectral Doppler was utilized to evaluate flow at rest and with distal augmentation maneuvers in the common femoral, femoral and popliteal veins. COMPARISON:  None. FINDINGS: Contralateral Common Femoral Vein: Respiratory phasicity is normal and symmetric with the symptomatic side. No evidence of thrombus. Normal compressibility. Common Femoral Vein: No evidence of thrombus. Normal compressibility, respiratory phasicity and response to augmentation. Saphenofemoral Junction: No evidence of thrombus. Normal compressibility and flow on color Doppler imaging. Profunda Femoral Vein: No evidence of thrombus. Normal compressibility and flow on color Doppler imaging. Femoral Vein: No evidence of thrombus. Normal compressibility, respiratory phasicity and response to augmentation. Popliteal Vein: Hypoechoic intraluminal thrombus. Thrombus is nonocclusive. Vessel is partially compressible. Phasic flow remains. Calf Veins: Thrombus does appear to extend into the left peroneal calf vein appearing occlusive. Vessel noncompressible. No phasic flow demonstrated. IMPRESSION: Positive exam for left popliteal DVT extending into the calf peroneal vein. Electronically Signed   By: Jerilynn Mages.  Shick M.D.   On: 07/30/2019 14:45   ECHOCARDIOGRAM COMPLETE  Result Date: 07/31/2019    ECHOCARDIOGRAM REPORT   Patient Name:   KATARZYNA WOLVEN Date of Exam: 07/31/2019  Medical Rec #:  [id number]       Height:       69.0 in Accession #:    [id number]      Weight:       346.1 lb Date of Birth:  [birth year]       BSA:          2.608 m Patient Age:    77 years        BP:           122/86 mmHg Patient Gender: F               HR:           52 bpm. Exam Location:  Inpatient Procedure: 2D Echo Indications:    Pulmonary Embolus I26.99  History:        Patient has no prior history of Echocardiogram examinations.                 Risk Factors:Hypertension.  Sonographer:    Mikki Santee RDCS (AE) Referring Phys: [id number] [place]  1. Left ventricular ejection fraction, by estimation, is 60 to 65%. The left ventricle has normal function. The left ventricle has no regional wall motion abnormalities. Left ventricular diastolic parameters were normal.  2. Right ventricular systolic function is normal. The right ventricular size is normal.  3. The mitral valve is normal in structure. Mild mitral valve regurgitation.  4. The aortic valve is grossly normal. Aortic valve regurgitation is not visualized.  5. The inferior vena cava is normal in size with greater than 50% respiratory variability, suggesting right atrial pressure of 3 mmHg. FINDINGS  Left Ventricle: Left ventricular ejection fraction, by estimation, is 60 to 65%. The left ventricle has normal function. The left ventricle has no regional wall motion abnormalities. The left ventricular internal cavity size was normal in size. There is  no left ventricular hypertrophy. Left ventricular diastolic parameters were normal. Right Ventricle: The right ventricular size is normal. No increase in right ventricular wall thickness. Right ventricular systolic function is normal. Left Atrium: Left atrial size was normal in size. Right Atrium: Right atrial size was normal in size. Pericardium: There is no evidence of pericardial effusion. Mitral Valve: The mitral valve is normal in structure. Mild mitral valve regurgitation. Tricuspid  Valve: The tricuspid valve is normal in structure. Tricuspid valve regurgitation is trivial. Aortic Valve: The aortic valve is grossly normal. Aortic valve regurgitation is not visualized. Pulmonic Valve: The pulmonic valve was grossly normal. Pulmonic valve regurgitation is not visualized. Aorta: The aortic root is normal in size and structure. Venous: The inferior vena cava is normal in size with greater than 50% respiratory variability, suggesting right atrial pressure of 3 mmHg. IAS/Shunts: No atrial level shunt detected by color flow Doppler.  LEFT VENTRICLE PLAX 2D LVIDd:         5.20 cm  Diastology LVIDs:         3.50 cm  LV e' lateral:   12.40 cm/s LV PW:         0.90 cm  LV E/e' lateral: 6.4 LV IVS:        1.00 cm  LV e' medial:    9.57 cm/s LVOT diam:     2.10 cm  LV E/e' medial:  8.3  LV SV:         94 LV SV Index:   36 LVOT Area:     3.46 cm  RIGHT VENTRICLE RV S prime:     14.50 cm/s LEFT ATRIUM             Index       RIGHT ATRIUM           Index LA diam:        2.40 cm 0.92 cm/m  RA Area:     13.50 cm LA Vol (A2C):   34.6 ml 13.27 ml/m RA Volume:   32.90 ml  12.61 ml/m LA Vol (A4C):   40.1 ml 15.37 ml/m LA Biplane Vol: 39.9 ml 15.30 ml/m  AORTIC VALVE LVOT Vmax:   124.00 cm/s LVOT Vmean:  79.300 cm/s LVOT VTI:    0.270 m MITRAL VALVE MV Area (PHT): 2.20 cm    SHUNTS MV Decel Time: 345 msec    Systemic VTI:  0.27 m MV E velocity: 79.30 cm/s  Systemic Diam: 2.10 cm MV A velocity: 36.00 cm/s MV E/A ratio:  2.20 Dorris Carnes MD Electronically signed by Dorris Carnes MD Signature Date/Time: 07/31/2019/12:33:29 PM    Final       Subjective:  Patient seen and examined at the bedside this morning.  Hemodynamically stable for discharge today. Discharge Exam: Vitals:   07/30/19 2125 07/31/19 0727  BP: 128/72 122/86  Pulse: 70 (!) 56  Resp: 17   Temp: 98.3 F (36.8 C) 98.1 F (36.7 C)  SpO2: 99% 99%   Vitals:   07/30/19 2034 07/30/19 2049 07/30/19 2125 07/31/19 0727  BP: 128/69  128/72  122/86  Pulse: 84  70 (!) 56  Resp: (!) 21  17   Temp:  98.1 F (36.7 C) 98.3 F (36.8 C) 98.1 F (36.7 C)  TempSrc:  Oral Oral   SpO2: 99%  99% 99%  Weight:   (!) 157 kg   Height:   5\' 9"  (1.753 m)     General: Pt is alert, awake, not in acute distress, morbidly obese Cardiovascular: RRR, S1/S2 +, no rubs, no gallops Respiratory: CTA bilaterally, no wheezing, no rhonchi Abdominal: Soft, NT, ND, bowel sounds + Extremities: no edema, no cyanosis    The results of significant diagnostics from this hospitalization (including imaging, microbiology, ancillary and laboratory) are listed below for reference.     Microbiology: Recent Results (from the past 240 hour(s))  SARS Coronavirus 2 by RT PCR (hospital order, performed in [facility] hospital lab) Nasopharyngeal Nasopharyngeal Swab     Status: None   Collection Time: 07/30/19  4:05 PM   Specimen: Nasopharyngeal Swab  Result Value Ref Range Status   SARS Coronavirus 2 NEGATIVE NEGATIVE Final    Comment: (NOTE) SARS-CoV-2 target nucleic acids are NOT DETECTED.  The SARS-CoV-2 RNA is generally detectable in upper and lower respiratory specimens during the acute phase of infection. The lowest concentration of SARS-CoV-2 viral copies this assay can detect is 250 copies / mL. A negative result does not preclude SARS-CoV-2 infection and should not be used as the sole basis for treatment or other patient management decisions.  A negative result may occur with improper specimen collection / handling, submission of specimen other than nasopharyngeal swab, presence of viral mutation(s) within the areas targeted by this assay, and inadequate number of viral copies (<250 copies / mL). A negative result must be combined with clinical observations, patient history, and epidemiological information.  Fact Sheet  for Patients:   StrictlyIdeas.no  Fact Sheet for Healthcare  Providers: BankingDealers.co.za  This test is not yet approved or  cleared by the [place] FDA and has been authorized for detection and/or diagnosis of SARS-CoV-2 by FDA under an Emergency Use Authorization (EUA).  This EUA will remain in effect (meaning this test can be used) for the duration of the COVID-19 declaration under Section 564(b)(1) of the Act, 21 U.S.C. section 360bbb-3(b)(1), unless the authorization is terminated or revoked sooner.  Performed at [facility], [address] [place], [place] [address]      Labs: BNP (last 3 results) No results for input(s): BNP in the last 8760 hours. Basic Metabolic Panel: Recent Labs  Lab 07/31/19 0353  NA 141  K 4.3  CL 107  CO2 27  GLUCOSE 93  BUN 14  CREATININE 0.85  CALCIUM 9.1   Liver Function Tests: No results for input(s): AST, ALT, ALKPHOS, BILITOT, PROT, ALBUMIN in the last 168 hours. No results for input(s): LIPASE, AMYLASE in the last 168 hours. No results for input(s): AMMONIA in the last 168 hours. CBC: Recent Labs  Lab 07/30/19 1628 07/31/19 0353  WBC 8.9 6.0  NEUTROABS 5.6  --   HGB 11.5* 10.6*  HCT 36.3 32.9*  MCV 87.9 88.7  PLT 193 192   Cardiac Enzymes: No results for input(s): CKTOTAL, CKMB, CKMBINDEX, TROPONINI in the last 168 hours. BNP: Invalid input(s): POCBNP CBG: No results for input(s): GLUCAP in the last 168 hours. D-Dimer No results for input(s): DDIMER in the last 72 hours. Hgb A1c No results for input(s): HGBA1C in the last 72 hours. Lipid Profile No results for input(s): CHOL, HDL, LDLCALC, TRIG, CHOLHDL, LDLDIRECT in the last 72 hours. Thyroid function studies No results for input(s): TSH, T4TOTAL, T3FREE, THYROIDAB in the last 72 hours.  Invalid input(s): FREET3 Anemia work up No results for input(s): VITAMINB12, FOLATE, FERRITIN, TIBC, IRON, RETICCTPCT in the last 72 hours. Urinalysis    Component Value Date/Time   COLORURINE  YELLOW 06/08/2017 1059   APPEARANCEUR CLOUDY (A) 06/08/2017 1059   LABSPEC 1.020 06/08/2017 1059   PHURINE 7.5 06/08/2017 1059   GLUCOSEU NEGATIVE 06/08/2017 1059   HGBUR NEGATIVE 06/08/2017 1059   BILIRUBINUR NEGATIVE 06/08/2017 1059   KETONESUR NEGATIVE 06/08/2017 1059   PROTEINUR NEGATIVE 06/08/2017 1059   UROBILINOGEN 1.0 12/01/2010 1351   NITRITE NEGATIVE 06/08/2017 1059   LEUKOCYTESUR NEGATIVE 06/08/2017 1059   Sepsis Labs Invalid input(s): PROCALCITONIN,  WBC,  LACTICIDVEN Microbiology Recent Results (from the past 240 hour(s))  SARS Coronavirus 2 by RT PCR (hospital order, performed in [facility] hospital lab) Nasopharyngeal Nasopharyngeal Swab     Status: None   Collection Time: 07/30/19  4:05 PM   Specimen: Nasopharyngeal Swab  Result Value Ref Range Status   SARS Coronavirus 2 NEGATIVE NEGATIVE Final    Comment: (NOTE) SARS-CoV-2 target nucleic acids are NOT DETECTED.  The SARS-CoV-2 RNA is generally detectable in upper and lower respiratory specimens during the acute phase of infection. The lowest concentration of SARS-CoV-2 viral copies this assay can detect is 250 copies / mL. A negative result does not preclude SARS-CoV-2 infection and should not be used as the sole basis for treatment or other patient management decisions.  A negative result may occur with improper specimen collection / handling, submission of specimen other than nasopharyngeal swab, presence of viral mutation(s) within the areas targeted by this assay, and inadequate number of viral copies (<250 copies / mL).  A negative result must be combined with clinical observations, patient history, and epidemiological information.  Fact Sheet for Patients:   StrictlyIdeas.no  Fact Sheet for Healthcare Providers: BankingDealers.co.za  This test is not yet approved or  cleared by the [place] FDA and has been authorized for detection and/or diagnosis  of SARS-CoV-2 by FDA under an Emergency Use Authorization (EUA).  This EUA will remain in effect (meaning this test can be used) for the duration of the COVID-19 declaration under Section 564(b)(1) of the Act, 21 U.S.C. section 360bbb-3(b)(1), unless the authorization is terminated or revoked sooner.  Performed at [facility], [address] [place], [place] [address]     Please note: You were cared for by a hospitalist during your hospital stay. Once you are discharged, your primary care physician will handle any further medical issues. Please note that NO REFILLS for any discharge medications will be authorized once you are discharged, as it is imperative that you return to your primary care physician (or establish a relationship with a primary care physician if you do not have one) for your post hospital discharge needs so that they can reassess your need for medications and monitor your lab values.    Time coordinating discharge: 40 minutes  SIGNED:   Shelly Coss, MD  [facility] 07/31/2019, 1:48 PM Pager [phone]  If 7PM-7AM, please contact night-coverage [email or web address] Password [email or web address]

## 2019-12-28 ENCOUNTER — Emergency Department (HOSPITAL_BASED_OUTPATIENT_CLINIC_OR_DEPARTMENT_OTHER): Payer: Federal, State, Local not specified - PPO

## 2019-12-28 ENCOUNTER — Emergency Department (HOSPITAL_BASED_OUTPATIENT_CLINIC_OR_DEPARTMENT_OTHER)
Admission: EM | Admit: 2019-12-28 | Discharge: 2019-12-28 | Disposition: A | Discharge status: Home / Self Care | Payer: Federal, State, Local not specified - PPO | Attending: Emergency Medicine | Admitting: Emergency Medicine

## 2019-12-28 ENCOUNTER — Encounter (HOSPITAL_BASED_OUTPATIENT_CLINIC_OR_DEPARTMENT_OTHER): Payer: Self-pay

## 2019-12-28 ENCOUNTER — Other Ambulatory Visit: Payer: Self-pay

## 2019-12-28 DIAGNOSIS — Z79899 Other long term (current) drug therapy: Secondary | ICD-10-CM | POA: Diagnosis not present

## 2019-12-28 DIAGNOSIS — R599 Enlarged lymph nodes, unspecified: Secondary | ICD-10-CM

## 2019-12-28 DIAGNOSIS — R0789 Other chest pain: Secondary | ICD-10-CM | POA: Diagnosis present

## 2019-12-28 DIAGNOSIS — I10 Essential (primary) hypertension: Secondary | ICD-10-CM | POA: Diagnosis not present

## 2019-12-28 DIAGNOSIS — R079 Chest pain, unspecified: Secondary | ICD-10-CM

## 2019-12-28 LAB — CBC
HCT: 34.6 % — ABNORMAL LOW (ref 36.0–46.0)
Hemoglobin: 11.2 g/dL — ABNORMAL LOW (ref 12.0–15.0)
MCH: 28 pg (ref 26.0–34.0)
MCHC: 32.4 g/dL (ref 30.0–36.0)
MCV: 86.5 fL (ref 80.0–100.0)
Platelets: 234 10*3/uL (ref 150–400)
RBC: 4 MIL/uL (ref 3.87–5.11)
RDW: 13.5 % (ref 11.5–15.5)
WBC: 4.8 10*3/uL (ref 4.0–10.5)
nRBC: 0 % (ref 0.0–0.2)

## 2019-12-28 LAB — BASIC METABOLIC PANEL
Anion gap: 5 (ref 5–15)
BUN: 16 mg/dL (ref 6–20)
CO2: 27 mmol/L (ref 22–32)
Calcium: 9 mg/dL (ref 8.9–10.3)
Chloride: 106 mmol/L (ref 98–111)
Creatinine, Ser: 0.84 mg/dL (ref 0.44–1.00)
GFR, Estimated: >60 mL/min (ref >60)
Glucose, Bld: 123 mg/dL — ABNORMAL HIGH (ref 70–99)
Potassium: 4.1 mmol/L (ref 3.5–5.1)
Sodium: 138 mmol/L (ref 135–145)

## 2019-12-28 LAB — TROPONIN I (HIGH SENSITIVITY)
Troponin I (High Sensitivity): 2 ng/L (ref <18)
Troponin I (High Sensitivity): 2 ng/L (ref <18)

## 2019-12-28 MED ORDER — IOHEXOL 350 MG/ML SOLN
100.0000 mL | Freq: Once | INTRAVENOUS | Status: AC | PRN
  Administered 2019-12-28: 100 mL via INTRAVENOUS

## 2019-12-28 NOTE — ED Triage Notes (Addendum)
Pt c/o pain to chest, upper back and neck x 2 days-states she feels same as when she was dx with "blood clots in my lungs" July 2021-states she is taking eloquis-NAD-steady gait

## 2019-12-28 NOTE — ED Provider Notes (Signed)
[facility]ERGENC[facility] DEPARTMENT Provider Note   CSN: [id number] Arrival date & time: 12/28/19  1203     History Chief Complaint  Patient presents with  . Chest Pain    Meagan Hahn is a 39 y.o. female with PMHx HTN, OSA, Migraines, and GERD who presents to the ED today with complaint of intermittent, sharp, substernal chest pain radiating into left arm/neck/left upper back x "months."  Patient reports this pain feels similar to when she was diagnosed with a PE in July.  She states she has been taking her Eliquis, is supposed to take it twice a day and will sometimes missed 1 dose versus the other.  She states she tries to be compliant but does note she has missed quite a lot of doses.  She states the pain comes and goes, thought that over a few months the pain would go away however it has been persistent causing her concern.  She came to the ED today as advised by her PCP.  She called to let them know she was still having pain and they told her to come to the ED for further evaluation.  Patient states she will feel short of breath when the pain occurs as well as lightheaded, no nausea or vomiting.  Patient is unsure what provoked the DVT and PE last time.  She denies any recent prolonged travel or immobilization.  She has not any oral birth control pills.  No hemoptysis.  No active malignancy.   The history is provided by the patient and medical records.       Past Medical History:  Diagnosis Date  . GERD (gastroesophageal reflux disease)   . Hypertension   . Migraines   . OSA (obstructive sleep apnea)     Patient Active Problem List   Diagnosis Date Noted  . Bilateral pulmonary embolism ([place]) 07/30/2019  . Acute deep vein thrombosis (DVT) of popliteal vein of left lower extremity ([place]) 07/30/2019  . OSA (obstructive sleep apnea)   . Migraines   . Hypertension     Past Surgical History:  Procedure Laterality Date  . CESAREAN SECTION    . CESAREAN SECTION        OB History    Gravida  3   Para  2   Term      Preterm      AB  1   Living        SAB      TAB      Ectopic      Multiple      Live Births              Family History  Problem Relation Age of Onset  . Migraines Mother   . Stroke Maternal Grandmother     Social History   Tobacco Use  . Smoking status: Never Smoker  . Smokeless tobacco: Never Used  Vaping Use  . Vaping Use: Never used  Substance Use Topics  . Alcohol use: Yes    Comment: occasionally  . Drug use: No    Home Medications Prior to Admission medications   Medication Sig Start Date End Date Taking? Authorizing Provider  apixaban (ELIQUIS) 5 MG TABS tablet Take 2 tablets (10 mg total) by mouth 2 (two) times daily. 07/31/19   Shelly Coss, MD  apixaban (ELIQUIS) 5 MG TABS tablet Take 1 tablet (5 mg total) by mouth 2 (two) times daily. 08/07/19   Shelly Coss, MD  losartan (COZAAR) 25 MG  tablet Take 25 mg by mouth daily. 06/23/19   [provider]  meloxicam (MOBIC) 15 MG tablet Take 15 mg by mouth daily. 07/16/19   [provider]  omeprazole (PRILOSEC) 20 MG capsule Take 20 mg by mouth as needed (acid reflux).  06/23/19   [provider]  topiramate (TOPAMAX) 25 MG tablet Take 50 mg by mouth at bedtime. INCREASE BY 1 WEEKLY TO 4 TABS AT BEDTIME 04/08/19   [provider]  valACYclovir (VALTREX) 500 MG tablet Take 500 mg by mouth daily.    [provider]    Allergies    Ciprofloxacin  Review of Systems   Review of Systems  Constitutional: Negative for chills and fever.  Respiratory: Positive for shortness of breath. Negative for cough.   Cardiovascular: Positive for chest pain. Negative for palpitations and leg swelling.  Gastrointestinal: Negative for abdominal pain, nausea and vomiting.  Neurological: Positive for light-headedness. Negative for dizziness, syncope and headaches.  All other systems reviewed and are negative.   Physical  Exam Updated Vital Signs BP (!) 144/80 (BP Location: Left Wrist)   Pulse (!) 102   Temp 98.5 F (36.9 C) (Oral)   Resp 20   Ht 5\' 11"  (1.803 m)   Wt (!) 165.1 kg   SpO2 98%   BMI 50.77 kg/m   Physical Exam Vitals and nursing note reviewed.  Constitutional:      Appearance: She is obese. She is not ill-appearing or diaphoretic.  HENT:     Head: Normocephalic and atraumatic.  Eyes:     Conjunctiva/sclera: Conjunctivae normal.  Cardiovascular:     Rate and Rhythm: Normal rate and regular rhythm.     Pulses:          Radial pulses are 2+ on the right side and 2+ on the left side.       Dorsalis pedis pulses are 2+ on the right side and 2+ on the left side.     Heart sounds: Normal heart sounds.  Pulmonary:     Effort: Pulmonary effort is normal.     Breath sounds: Normal breath sounds. No decreased breath sounds, wheezing, rhonchi or rales.  Abdominal:     Palpations: Abdomen is soft.     Tenderness: There is no abdominal tenderness. There is no guarding or rebound.  Musculoskeletal:     Cervical back: Neck supple.     Right lower leg: No tenderness. No edema.     Left lower leg: No tenderness. No edema.  Skin:    General: Skin is warm and dry.  Neurological:     Mental Status: She is alert.     ED Results / Procedures / Treatments   Labs (all labs ordered are listed, but only abnormal results are displayed) Labs Reviewed  BASIC METABOLIC PANEL - Abnormal; Notable for the following components:      Result Value   Glucose, Bld 123 (*)    All other components within normal limits  CBC - Abnormal; Notable for the following components:   Hemoglobin 11.2 (*)    HCT 34.6 (*)    All other components within normal limits  PREGNANCY, URINE  TROPONIN I (HIGH SENSITIVITY)  TROPONIN I (HIGH SENSITIVITY)    EKG EKG Interpretation  Date/Time:  Monday December 28 2019 12:14:55 EST Ventricular Rate:  91 PR Interval:  146 QRS Duration: 70 QT Interval:  356 QTC  Calculation: 437 R Axis:   50 Text Interpretation: Normal sinus rhythm Normal ECG  Confirmed by Veryl Speak [phone]) on 12/28/2019 12:43:12 PM   Radiology DG Chest 2 View  Result Date: 12/28/2019 CLINICAL DATA:  Chest pain radiating to the back over the last 2 days. EXAM: CHEST - 2 VIEW COMPARISON:  07/30/2019 FINDINGS: Heart size is normal. Mediastinal shadows are normal. The lungs are clear. No bronchial thickening. No infiltrate, mass, effusion or collapse. Pulmonary vascularity is normal. No bony abnormality. IMPRESSION: Normal chest. Electronically Signed   By: Nelson Chimes M.D.   On: 12/28/2019 12:47   CT Angio Chest PE W/Cm &/Or Wo Cm  Result Date: 12/28/2019 CLINICAL DATA:  Chest pain and upper back and neck pain X 2 days. History of blood clots in lung per patient 7/21. Has hypertension. Suspected pulmonary embolus. EXAM: CT ANGIOGRAPHY CHEST WITH CONTRAST TECHNIQUE: Multidetector CT imaging of the chest was performed using the standard protocol during bolus administration of intravenous contrast. Multiplanar CT image reconstructions and MIPs were obtained to evaluate the vascular anatomy. CONTRAST:  157mL OMNIPAQUE IOHEXOL 350 MG/ML SOLN COMPARISON:  CT angiography chest 07/30/2019. FINDINGS: Cardiovascular: Fairly satisfactory opacification of the pulmonary arteries to the segmental level. The main pulmonary artery is normal in caliber. No evidence of pulmonary embolism. Normal heart size. No significant pericardial effusion. The thoracic aorta is normal in caliber. No atherosclerotic plaque of the thoracic aorta. No coronary artery calcifications. Mediastinum/Nodes: No enlarged mediastinal or hilar lymph nodes. No right axillary lymphadenopathy. Slightly more prominent borderline enlarged left axillary lymph node measuring up to 1.1 cm (4:38). Thyroid gland, trachea, and esophagus demonstrate no significant findings. Lungs/Pleura: Expiratory phase of respiration. No focal consolidation. No  pulmonary nodule. No pulmonary mass. No pleural effusion. No pneumothorax. Upper Abdomen: No acute abnormality. Musculoskeletal: No chest wall abnormality No suspicious lytic or blastic osseous lesions. No acute displaced fracture. Multilevel degenerative changes of the spine including mild facet arthropathy at the left C5-C6 facets. Review of the MIP images confirms the above findings. IMPRESSION: 1. No central or segmental pulmonary embolus. 2. No acute intrathoracic abnormality. 3. Nonspecific slightly more prominent/borderline enlarged left axillary lymph nodes measuring up to 1.1 cm. Correlate with recent left arm COVID 19 vaccine as well as left axillary region physical exam. If clinically indicated, consider mammographic evaluation. Electronically Signed   By: Iven Finn M.D.   On: 12/28/2019 15:17    Procedures Procedures (including critical care time)  Medications Ordered in ED Medications  iohexol (OMNIPAQUE) 350 MG/ML injection 100 mL (100 mLs Intravenous Contrast Given 12/28/19 1453)    ED Course  I have reviewed the triage vital signs and the nursing notes.  Pertinent labs & imaging results that were available during my care of the patient were reviewed by me and considered in my medical decision making (see chart for details).    MDM Rules/Calculators/A&P                          39 year old female with recent diagnosis of DVT in left lower extremity and PE in July, unprovoked who presents to the ED today with continued complaint of intermittent chest pain radiating into left arm, left upper back, neck which she states is similar to when she was diagnosed with a PE.  She has been somewhat compliant with her Eliquis however does admit to missing doses here and there.  On arrival to the ED vitals include a temperature of 98.5, mildly tachycardic at 102, nontachypneic.  Satting 98% on room air.  On exam patient  appears to be in no acute distress.  She denies any active chest pain  currently.  She denies any shortness of breath.  KG with normal sinus rhythm and no acute ischemic changes.  A CBC with a stable hemoglobin of 11.2, no leukocytosis.  BMP with glucose 123, no other electrolyte abnormalities.  Troponin pending.  Given missed doses of Eliquis with a history of PE and recurrent chest pain will obtain CTA at this time.  CTA negative for PE at this time. Incidentally pt does have an enlarged axillary lymph node on the left side measuring 1.1 cm; radiologist states that this may correlate to patient's recent Covid vaccine however she states she received a Covid vaccine in June.  On exam patient is noted to have an enlarged mobile lymph node on this side.  She states she has had a mammogram in the past however has not had one in several years, when questioned as to why she had a mammogram prior to the age of 53 she is unsure why however denies any family history of breast or cervical cancer.  Patient follow-up with the [facility] center [facility] for further evaluation.   Repeat troponin unchanged at 2.  It does appear that a urine pregnancy test was ordered however never collected prior to patient going to CTA.  She denies any risk of pregnancy, states she was told that she is in premenopause and has not had a menstrual cycle in over 1 year.  We will discontinue urine pregnancy test at this time.  Lab work and work-up essentially negative at this time.  Difficult to say while patient is having recurrent symptoms after being diagnosed with a PE in July.  Have advised that she follow-up with her PCP for same given negative work-up today.  Have instructed on compliance of Eliquis as prescribed.  Patient is in agreement with plan.  She is stable for discharge at this time.   This note was prepared using Dragon voice recognition software and may include unintentional dictation errors due to the inherent limitations of voice recognition software.  Final Clinical Impression(s) / ED  Diagnoses Final diagnoses:  Nonspecific chest pain  Enlarged lymph node    Rx / DC Orders ED Discharge Orders    None       Discharge Instructions     Your labwork and imaging were reassuring today without signs of recurrent pulmonary emboli. Please follow up with your PCP regarding your ED visit today. Take your Eliquis as prescribed twice a day and do not miss any doses.   Your CT scan did show an enlarged lymph node in your left arm pit. Please follow up with [facility] for further evaluation and for a mammogram.   Return to the ED for any worsening symptoms       Eustaquio Maize, PA-C 12/28/19 1557    Veryl Speak, MD 12/29/19 [phone]

## 2019-12-28 NOTE — Discharge Instructions (Signed)
Your labwork and imaging were reassuring today without signs of recurrent pulmonary emboli. Please follow up with your PCP regarding your ED visit today. Take your Eliquis as prescribed twice a day and do not miss any doses.   Your CT scan did show an enlarged lymph node in your left arm pit. Please follow up with [facility] for further evaluation and for a mammogram.   Return to the ED for any worsening symptoms

## 2020-09-29 ENCOUNTER — Encounter (HOSPITAL_BASED_OUTPATIENT_CLINIC_OR_DEPARTMENT_OTHER): Payer: Self-pay | Admitting: *Deleted

## 2020-09-29 ENCOUNTER — Emergency Department (HOSPITAL_BASED_OUTPATIENT_CLINIC_OR_DEPARTMENT_OTHER)
Admission: EM | Admit: 2020-09-29 | Discharge: 2020-09-30 | Disposition: A | Discharge status: Home / Self Care | Payer: Federal, State, Local not specified - PPO | Attending: Emergency Medicine | Admitting: Emergency Medicine

## 2020-09-29 ENCOUNTER — Other Ambulatory Visit: Payer: Self-pay

## 2020-09-29 ENCOUNTER — Emergency Department (HOSPITAL_BASED_OUTPATIENT_CLINIC_OR_DEPARTMENT_OTHER): Payer: Federal, State, Local not specified - PPO

## 2020-09-29 DIAGNOSIS — R072 Precordial pain: Secondary | ICD-10-CM | POA: Diagnosis not present

## 2020-09-29 DIAGNOSIS — R079 Chest pain, unspecified: Secondary | ICD-10-CM | POA: Diagnosis present

## 2020-09-29 DIAGNOSIS — I10 Essential (primary) hypertension: Secondary | ICD-10-CM | POA: Insufficient documentation

## 2020-09-29 DIAGNOSIS — Z79899 Other long term (current) drug therapy: Secondary | ICD-10-CM | POA: Diagnosis not present

## 2020-09-29 DIAGNOSIS — Z7901 Long term (current) use of anticoagulants: Secondary | ICD-10-CM | POA: Insufficient documentation

## 2020-09-29 DIAGNOSIS — R319 Hematuria, unspecified: Secondary | ICD-10-CM | POA: Insufficient documentation

## 2020-09-29 HISTORY — DX: Other pulmonary embolism without acute cor pulmonale: I26.99

## 2020-09-29 LAB — URINALYSIS, MICROSCOPIC (REFLEX)

## 2020-09-29 LAB — CBC WITH DIFFERENTIAL/PLATELET
Abs Immature Granulocytes: 0.02 10*3/uL (ref 0.00–0.07)
Basophils Absolute: 0 10*3/uL (ref 0.0–0.1)
Basophils Relative: 1 %
Eosinophils Absolute: 0.2 10*3/uL (ref 0.0–0.5)
Eosinophils Relative: 4 %
HCT: 36.5 % (ref 36.0–46.0)
Hemoglobin: 12.3 g/dL (ref 12.0–15.0)
Immature Granulocytes: 0 %
Lymphocytes Relative: 35 %
Lymphs Abs: 2 10*3/uL (ref 0.7–4.0)
MCH: 29.4 pg (ref 26.0–34.0)
MCHC: 33.7 g/dL (ref 30.0–36.0)
MCV: 87.1 fL (ref 80.0–100.0)
Monocytes Absolute: 0.5 10*3/uL (ref 0.1–1.0)
Monocytes Relative: 8 %
Neutro Abs: 3 10*3/uL (ref 1.7–7.7)
Neutrophils Relative %: 52 %
Platelets: 226 10*3/uL (ref 150–400)
RBC: 4.19 MIL/uL (ref 3.87–5.11)
RDW: 13.2 % (ref 11.5–15.5)
WBC: 5.7 10*3/uL (ref 4.0–10.5)
nRBC: 0 % (ref 0.0–0.2)

## 2020-09-29 LAB — COMPREHENSIVE METABOLIC PANEL
ALT: 21 U/L (ref 0–44)
AST: 24 U/L (ref 15–41)
Albumin: 4.4 g/dL (ref 3.5–5.0)
Alkaline Phosphatase: 83 U/L (ref 38–126)
Anion gap: 6 (ref 5–15)
BUN: 18 mg/dL (ref 6–20)
CO2: 28 mmol/L (ref 22–32)
Calcium: 9.5 mg/dL (ref 8.9–10.3)
Chloride: 106 mmol/L (ref 98–111)
Creatinine, Ser: 0.95 mg/dL (ref 0.44–1.00)
GFR, Estimated: >60 mL/min (ref >60)
Glucose, Bld: 78 mg/dL (ref 70–99)
Potassium: 3.5 mmol/L (ref 3.5–5.1)
Sodium: 140 mmol/L (ref 135–145)
Total Bilirubin: 0.4 mg/dL (ref 0.3–1.2)
Total Protein: 8.2 g/dL — ABNORMAL HIGH (ref 6.5–8.1)

## 2020-09-29 LAB — URINALYSIS, ROUTINE W REFLEX MICROSCOPIC
Bilirubin Urine: NEGATIVE
Glucose, UA: NEGATIVE mg/dL
Ketones, ur: NEGATIVE mg/dL
Leukocytes,Ua: NEGATIVE
Nitrite: NEGATIVE
Protein, ur: NEGATIVE mg/dL
Specific Gravity, Urine: 1.03 (ref 1.005–1.030)
pH: 6 (ref 5.0–8.0)

## 2020-09-29 LAB — TROPONIN I (HIGH SENSITIVITY): Troponin I (High Sensitivity): 3 ng/L (ref <18)

## 2020-09-29 LAB — D-DIMER, QUANTITATIVE: D-Dimer, Quant: 0.47 ug/mL-FEU (ref 0.00–0.50)

## 2020-09-29 LAB — PREGNANCY, URINE: Preg Test, Ur: NEGATIVE

## 2020-09-29 NOTE — Discharge Instructions (Signed)
Given your history of blood clots, recommend you continue taking your Eliquis. There was no evidence for an active gastrointestinal bleed at this time. Your cardiac enzymes were normal and a test that looks for evidence of blood clots was also normal. Recommend you follow up with a cardiologist for further outpatient management.

## 2020-09-29 NOTE — ED Triage Notes (Addendum)
C/o blood in urine x 2 days, also reports chest pain " off and on " x 1 week , and has been off eliquis x 1 week , recent DX PE

## 2020-09-29 NOTE — ED Provider Notes (Signed)
Patient care assumed at 0000. Patient here for evaluation of exertional chest pain repeat troponin pending.  Troponin is negative times two. Patient is asymptomatic on reassessment. Plan to discharge home with outpatient follow-up and return precautions.   Quintella Reichert, MD 09/30/20 [phone]

## 2020-09-29 NOTE — ED Provider Notes (Signed)
[facility] EMERGENCY DEPARTMENT Provider Note   CSN: [id number] Arrival date & time: 09/29/20  1629     History Chief Complaint  Patient presents with   Hematuria   Chest Pain    Meagan Hahn is a 40 y.o. female.   Chest Pain Associated symptoms: no abdominal pain, no back pain, no cough, no fever, no palpitations, no shortness of breath and no vomiting    40 year old female with a history of pulmonary embolism on Eliquis (has been intermittently on Eliquis for the past week due to inability for the medication) presenting to the emergency department with chest pain and an episode of bright red blood upon wiping her rectum yesterday.  No pain.  The patient states that she has had substernal chest pressure for the past week.  She describes it is somewhat pleuritic but also describes a pressure sensation that is worse on exertion.  She denies any current shortness of breath.  She states that she has no symptoms currently.  She endorses only a small amount of bright red blood on wiping yesterday.  No further episodes of bleeding.  She states that she last took her Eliquis this morning.  She denies any fevers or chills, cough or congestion.  Past Medical History:  Diagnosis Date   GERD (gastroesophageal reflux disease)    Hypertension    Migraines    OSA (obstructive sleep apnea)    Pulmonary embolism [facility])     Patient Active Problem List   Diagnosis Date Noted   Bilateral pulmonary embolism ([place]) 07/30/2019   Acute deep vein thrombosis (DVT) of popliteal vein of left lower extremity ([place]) 07/30/2019   OSA (obstructive sleep apnea)    Migraines    Hypertension     Past Surgical History:  Procedure Laterality Date   CESAREAN SECTION     CESAREAN SECTION       OB History     Gravida  3   Para  2   Term      Preterm      AB  1   Living         SAB      IAB      Ectopic      Multiple      Live Births              Family History   Problem Relation Age of Onset   Migraines Mother    Stroke Maternal Grandmother     Social History   Tobacco Use   Smoking status: Never   Smokeless tobacco: Never  Vaping Use   Vaping Use: Never used  Substance Use Topics   Alcohol use: Yes    Comment: occasionally   Drug use: No    Home Medications Prior to Admission medications   Medication Sig Start Date End Date Taking? Authorizing Provider  apixaban (ELIQUIS) 5 MG TABS tablet Take 2 tablets (10 mg total) by mouth 2 (two) times daily. 07/31/19   Shelly Coss, MD  apixaban (ELIQUIS) 5 MG TABS tablet Take 1 tablet (5 mg total) by mouth 2 (two) times daily. 08/07/19   Shelly Coss, MD  losartan (COZAAR) 25 MG tablet Take 25 mg by mouth daily. 06/23/19   [provider]  meloxicam (MOBIC) 15 MG tablet Take 15 mg by mouth daily. 07/16/19   [provider]  omeprazole (PRILOSEC) 20 MG capsule Take 20 mg by mouth as needed (acid reflux).  06/23/19   [provider]  topiramate (TOPAMAX) 25 MG tablet Take 50 mg by mouth at bedtime. INCREASE BY 1 WEEKLY TO 4 TABS AT BEDTIME 04/08/19   [provider]  valACYclovir (VALTREX) 500 MG tablet Take 500 mg by mouth daily.    [provider]    Allergies    Ciprofloxacin  Review of Systems   Review of Systems  Constitutional:  Negative for chills and fever.  HENT:  Negative for ear pain and sore throat.   Eyes:  Negative for pain and visual disturbance.  Respiratory:  Negative for cough and shortness of breath.   Cardiovascular:  Positive for chest pain. Negative for palpitations.  Gastrointestinal:  Positive for blood in stool. Negative for abdominal pain, rectal pain and vomiting.  Genitourinary:  Negative for dysuria and hematuria.  Musculoskeletal:  Negative for arthralgias and back pain.  Skin:  Negative for color change and rash.  Neurological:  Negative for seizures and syncope.  All other systems reviewed and are  negative.  Physical Exam Updated Vital Signs BP 134/84   Pulse (!) 55   Temp 99 F (37.2 C) (Oral)   Resp (!) 7   Ht '5\' 9"'$  (1.753 m)   Wt (!) 158.8 kg   SpO2 98%   BMI 51.69 kg/m   Physical Exam Vitals and nursing note reviewed. Exam conducted with a chaperone present.  Constitutional:      General: She is not in acute distress.    Appearance: She is well-developed.  HENT:     Head: Normocephalic and atraumatic.  Eyes:     Conjunctiva/sclera: Conjunctivae normal.  Cardiovascular:     Rate and Rhythm: Normal rate and regular rhythm.     Heart sounds: No murmur heard. Pulmonary:     Effort: Pulmonary effort is normal. No respiratory distress.     Breath sounds: Normal breath sounds.  Abdominal:     Palpations: Abdomen is soft.     Tenderness: There is no abdominal tenderness.  Genitourinary:    Rectum: Normal.  Musculoskeletal:     Cervical back: Neck supple.  Skin:    General: Skin is warm and dry.  Neurological:     Mental Status: She is alert.    ED Results / Procedures / Treatments   Labs (all labs ordered are listed, but only abnormal results are displayed) Labs Reviewed  COMPREHENSIVE METABOLIC PANEL - Abnormal; Notable for the following components:      Result Value   Total Protein 8.2 (*)    All other components within normal limits  URINALYSIS, ROUTINE W REFLEX MICROSCOPIC - Abnormal; Notable for the following components:   APPearance HAZY (*)    Hgb urine dipstick LARGE (*)    All other components within normal limits  URINALYSIS, MICROSCOPIC (REFLEX) - Abnormal; Notable for the following components:   Bacteria, UA FEW (*)    All other components within normal limits  CBC WITH DIFFERENTIAL/PLATELET  D-DIMER, QUANTITATIVE  PREGNANCY, URINE  TROPONIN I (HIGH SENSITIVITY)  TROPONIN I (HIGH SENSITIVITY)    EKG EKG Interpretation  Date/Time:  Thursday September 29 2020 16:41:02 EDT Ventricular Rate:  84 PR Interval:  150 QRS Duration: 80 QT  Interval:  364 QTC Calculation: 430 R Axis:   37 Text Interpretation: Normal sinus rhythm Normal ECG Confirmed by Regan Lemming ([id number]) on 09/29/2020 11:38:14 PM  Radiology DG Chest 2 View  Result Date: 09/29/2020 CLINICAL DATA:  Chest pain EXAM: CHEST - 2 VIEW COMPARISON:  12/28/2019 FINDINGS: The heart size and mediastinal  contours are within normal limits. Both lungs are clear. The visualized skeletal structures are unremarkable. IMPRESSION: No active cardiopulmonary disease. Electronically Signed   By: Donavan Foil M.D.   On: 09/29/2020 22:40    Procedures Procedures   Medications Ordered in ED Medications - No data to display  ED Course  I have reviewed the triage vital signs and the nursing notes.  Pertinent labs & imaging results that were available during my care of the patient were reviewed by me and considered in my medical decision making (see chart for details).    MDM Rules/Calculators/A&P HEAR Score: 2                         40 year old female presenting to the emergency department with precordial chest discomfort that appears exertional in nature.  She does have a history of PE and is on Eliquis but has been intermittently compliant with the medication due to financial ability to afford the medication.  She states that she currently has the medication and last took it this morning.  She had been off it for the past couple of days.  She additionally endorsed 1 solitary episode of bright red blood per rectum, described as a small amount noticed when wiping.  No blood mixed in with her stool.  No abdominal pain or discomfort, nausea, vomiting, diarrhea or dysuria.  No hematuria that she was able to note.  No flank pain.  The patient states that her chest pain has resolved with rest.  No current shortness of breath.  Differential diagnosis includes ACS, PE, stable angina, musculoskeletal pain, anxiety, GERD, PUD, bleeding hemorrhoid, anal fissure, diverticular lower GI  bleed.  On exam, the patient had no evidence of bleeding.  Her fecal occult was negative.  She had no evidence of hemorrhoid or anal fissure.  Unclear etiology of her bleed although it appears to have stopped at this time.  Given her history of PE, I advised that she continue to take her Eliquis given the minimal bleeding noted.  The patient's initial EKG was nonischemic.  CTA imaging of the chest felt to be not necessary as the patient had a negative D-dimer.  She has no leukocytosis, anemia or platelet abnormality.  Initial troponin was normal.  CMP is unremarkable.   Chest x-ray was without focal consolidation.  No active cardiopulmonary disease.  The patient is a hear score of 2.  Plan for delta troponin and follow-up outpatient with plan for follow-up with a cardiologist as the patient symptoms do appear to be consistent with stable angina.  Signout given to Dr. Ralene Bathe at 2330. Final Clinical Impression(s) / ED Diagnoses Final diagnoses:  Precordial pain    Rx / DC Orders ED Discharge Orders     None        Regan Lemming, MD 09/30/20 [id number]

## 2020-09-30 LAB — TROPONIN I (HIGH SENSITIVITY): Troponin I (High Sensitivity): 3 ng/L (ref <18)

## 2021-05-09 ENCOUNTER — Encounter (HOSPITAL_BASED_OUTPATIENT_CLINIC_OR_DEPARTMENT_OTHER): Payer: Self-pay | Admitting: Emergency Medicine

## 2021-05-09 ENCOUNTER — Emergency Department (HOSPITAL_BASED_OUTPATIENT_CLINIC_OR_DEPARTMENT_OTHER): Payer: Federal, State, Local not specified - PPO

## 2021-05-09 ENCOUNTER — Other Ambulatory Visit (HOSPITAL_BASED_OUTPATIENT_CLINIC_OR_DEPARTMENT_OTHER): Payer: Self-pay

## 2021-05-09 ENCOUNTER — Emergency Department (HOSPITAL_BASED_OUTPATIENT_CLINIC_OR_DEPARTMENT_OTHER)
Admission: EM | Admit: 2021-05-09 | Discharge: 2021-05-09 | Disposition: A | Discharge status: Home / Self Care | Payer: Federal, State, Local not specified - PPO | Attending: Emergency Medicine | Admitting: Emergency Medicine

## 2021-05-09 ENCOUNTER — Other Ambulatory Visit: Payer: Self-pay

## 2021-05-09 DIAGNOSIS — Z7901 Long term (current) use of anticoagulants: Secondary | ICD-10-CM | POA: Insufficient documentation

## 2021-05-09 DIAGNOSIS — Z79899 Other long term (current) drug therapy: Secondary | ICD-10-CM | POA: Diagnosis not present

## 2021-05-09 DIAGNOSIS — I10 Essential (primary) hypertension: Secondary | ICD-10-CM | POA: Insufficient documentation

## 2021-05-09 DIAGNOSIS — R0602 Shortness of breath: Secondary | ICD-10-CM | POA: Insufficient documentation

## 2021-05-09 DIAGNOSIS — R079 Chest pain, unspecified: Secondary | ICD-10-CM | POA: Diagnosis not present

## 2021-05-09 DIAGNOSIS — M542 Cervicalgia: Secondary | ICD-10-CM | POA: Insufficient documentation

## 2021-05-09 LAB — CBC WITH DIFFERENTIAL/PLATELET
Abs Immature Granulocytes: 0.01 10*3/uL (ref 0.00–0.07)
Basophils Absolute: 0 10*3/uL (ref 0.0–0.1)
Basophils Relative: 1 %
Eosinophils Absolute: 0.1 10*3/uL (ref 0.0–0.5)
Eosinophils Relative: 3 %
HCT: 35.2 % — ABNORMAL LOW (ref 36.0–46.0)
Hemoglobin: 11.9 g/dL — ABNORMAL LOW (ref 12.0–15.0)
Immature Granulocytes: 0 %
Lymphocytes Relative: 35 %
Lymphs Abs: 1.7 10*3/uL (ref 0.7–4.0)
MCH: 28.7 pg (ref 26.0–34.0)
MCHC: 33.8 g/dL (ref 30.0–36.0)
MCV: 84.8 fL (ref 80.0–100.0)
Monocytes Absolute: 0.4 10*3/uL (ref 0.1–1.0)
Monocytes Relative: 8 %
Neutro Abs: 2.7 10*3/uL (ref 1.7–7.7)
Neutrophils Relative %: 53 %
Platelets: 231 10*3/uL (ref 150–400)
RBC: 4.15 MIL/uL (ref 3.87–5.11)
RDW: 13 % (ref 11.5–15.5)
WBC: 5 10*3/uL (ref 4.0–10.5)
nRBC: 0 % (ref 0.0–0.2)

## 2021-05-09 LAB — BASIC METABOLIC PANEL
Anion gap: 6 (ref 5–15)
BUN: 15 mg/dL (ref 6–20)
CO2: 27 mmol/L (ref 22–32)
Calcium: 9.2 mg/dL (ref 8.9–10.3)
Chloride: 108 mmol/L (ref 98–111)
Creatinine, Ser: 0.94 mg/dL (ref 0.44–1.00)
GFR, Estimated: >60 mL/min (ref >60)
Glucose, Bld: 88 mg/dL (ref 70–99)
Potassium: 3.8 mmol/L (ref 3.5–5.1)
Sodium: 141 mmol/L (ref 135–145)

## 2021-05-09 LAB — TROPONIN I (HIGH SENSITIVITY)
Troponin I (High Sensitivity): <2 ng/L (ref <18)
Troponin I (High Sensitivity): 2 ng/L (ref <18)

## 2021-05-09 MED ORDER — TRAMADOL HCL 50 MG PO TABS: 50.0000 mg | ORAL_TABLET | Freq: Four times a day (QID) | ORAL | 0 refills | Status: AC | PRN

## 2021-05-09 MED ORDER — OXYCODONE-ACETAMINOPHEN 5-325 MG PO TABS
1.0000 | ORAL_TABLET | Freq: Once | ORAL | Status: AC
  Administered 2021-05-09: 1 via ORAL
  Filled 2021-05-09: qty 1

## 2021-05-09 MED ORDER — IOHEXOL 350 MG/ML SOLN
100.0000 mL | Freq: Once | INTRAVENOUS | Status: AC | PRN
  Administered 2021-05-09: 100 mL via INTRAVENOUS

## 2021-05-09 NOTE — Discharge Instructions (Signed)
You were seen in the emergency department today for neck pain and shortness of breath.  Your work-up here has been reassuring.  You likely have a neck strain on the right side.  I would encourage you to take Tylenol as needed during the day.  You may take tramadol which I am prescribing you at night.  You have been prescribed a medication that is considered an opiate. Opiates are pain medications that should be used with caution. It is important that you do not drive while taking this medication as it can cause drowsiness and impaired reaction times. Do not mix this medication with benzodiazepine medications or alcohol as this can cause respiratory depression. Additionally, opiates have addicting properties to them. Please use medication as prescribed by your provider.  Please return to the emergency department for worsening chest pain or shortness of breath.  I am also referring you to hematology for follow-up on your DVT/PE and your concern about whether you need Eliquis ongoing.  Please follow-up with in the outpatient setting ?

## 2021-05-09 NOTE — Progress Notes (Signed)
RNCM received TOC consult for medication assistance ( possibly for Eliquis). This RNCM spoke with [facility] to determine if they are able to provide the Eliquis 30 day trial offer or $10 card. Pharmacy is able to provide. ? ?RNCM spoke with bedside RN Shirlean Mylar regarding the [facility] consult received. Shirlean Mylar will have EDP call this RNCM.  ? ? ?TOC will continue to follow.  ?

## 2021-05-09 NOTE — ED Triage Notes (Signed)
Pt reports back pain that radiates to her neck and chest. Pt also reports SHOB.  ?

## 2021-05-09 NOTE — ED Provider Notes (Signed)
?[facility] ?Provider Note ? ? ?CSN: [id number] ?Arrival date & time: 05/09/21  1226 ? ?  ? ?History ? ?Chief Complaint  ?Patient presents with  ? Back Pain  ? ? ?Meagan Hahn is a 41 y.o. female.  With past medical history of hypertension, DVT, PE who presents to the emergency department with neck pain, chest pain and shortness of breath. ? ?Patient states that symptoms began on Sunday.  She describes having right-sided neck pain that she describes as sharp and shooting.  She states she is having increased pain with turning her head to the right.  She denies any trauma or injury or notably sleeping wrong.  She is also endorsed on Sunday having the sharp shooting pain that goes into her right shoulder and into her chest.  She has had associated central chest pain that she describes as sharp as well as shortness of breath.  She states that chest pain is worse with deep breaths.  She is also noted a fluttering sensation.  She denies any cough, fever, recent illnesses.  She denies any lower extremity swelling, recent travel.  She states that she has history of PE and was placed on Eliquis however has not "taken it in a while because it is too expensive."   ? ?Back Pain ?Associated symptoms: chest pain   ?Associated symptoms: no abdominal pain, no fever and no headaches   ? ?  ? ?Home Medications ?Prior to Admission medications   ?Medication Sig Start Date End Date Taking? Authorizing Provider  ?apixaban (ELIQUIS) 5 MG TABS tablet Take 2 tablets (10 mg total) by mouth 2 (two) times daily. 07/31/19   Shelly Coss, MD  ?apixaban (ELIQUIS) 5 MG TABS tablet Take 1 tablet (5 mg total) by mouth 2 (two) times daily. 08/07/19   Shelly Coss, MD  ?losartan (COZAAR) 25 MG tablet Take 25 mg by mouth daily. 06/23/19   [provider]  ?meloxicam (MOBIC) 15 MG tablet Take 15 mg by mouth daily. 07/16/19   [provider]  ?omeprazole (PRILOSEC) 20 MG capsule Take 20 mg by mouth as  needed (acid reflux).  06/23/19   [provider]  ?topiramate (TOPAMAX) 25 MG tablet Take 50 mg by mouth at bedtime. INCREASE BY 1 WEEKLY TO 4 TABS AT BEDTIME 04/08/19   [provider]  ?valACYclovir (VALTREX) 500 MG tablet Take 500 mg by mouth daily.    [provider]  ?   ? ?Allergies    ?Ciprofloxacin   ? ?Review of Systems   ?Review of Systems  ?Constitutional:  Negative for diaphoresis and fever.  ?Respiratory:  Positive for shortness of breath. Negative for cough.   ?Cardiovascular:  Positive for chest pain and palpitations. Negative for leg swelling.  ?Gastrointestinal:  Negative for abdominal pain and nausea.  ?Musculoskeletal:  Positive for neck pain and neck stiffness.  ?Neurological:  Positive for light-headedness. Negative for dizziness, syncope and headaches.  ?All other systems reviewed and are negative. ? ?Physical Exam ?Updated Vital Signs ?BP (!) 164/113 (BP Location: Left Arm)   Pulse (!) 110   Temp 98.4 ?F (36.9 ?C) (Oral)   Resp 20   SpO2 98%  ?Physical Exam ?Vitals and nursing note reviewed.  ?Constitutional:   ?   General: She is not in acute distress. ?   Appearance: Normal appearance. She is obese. She is not ill-appearing or toxic-appearing.  ?HENT:  ?   Head: Normocephalic and atraumatic.  ?   Nose: Nose normal.  ?  Mouth/Throat:  ?   Mouth: Mucous membranes are moist.  ?   Pharynx: Oropharynx is clear.  ?Eyes:  ?   General: No scleral icterus. ?   Extraocular Movements: Extraocular movements intact.  ?   Conjunctiva/sclera: Conjunctivae normal.  ?Neck:  ?   Vascular: No carotid bruit or JVD.  ? ?Cardiovascular:  ?   Rate and Rhythm: Regular rhythm. Tachycardia present.  ?   Pulses: Normal pulses.  ?   Heart sounds: Normal heart sounds. No murmur heard. ?Pulmonary:  ?   Effort: Pulmonary effort is normal. No respiratory distress.  ?   Breath sounds: Normal breath sounds.  ?Abdominal:  ?   General: Bowel sounds are normal.  ?   Palpations: Abdomen is soft.   ?Musculoskeletal:  ?   Cervical back: Neck supple. Tenderness present. No rigidity. Pain with movement and muscular tenderness present. No spinous process tenderness. Decreased range of motion.  ?Skin: ?   General: Skin is warm and dry.  ?   Capillary Refill: Capillary refill takes less than 2 seconds.  ?Neurological:  ?   General: No focal deficit present.  ?   Mental Status: She is alert and oriented to person, place, and time. Mental status is at baseline.  ?Psychiatric:     ?   Mood and Affect: Mood normal.     ?   Behavior: Behavior normal.     ?   Thought Content: Thought content normal.     ?   Judgment: Judgment normal.  ? ? ?ED Results / Procedures / Treatments   ?Labs ?(all labs ordered are listed, but only abnormal results are displayed) ?Labs Reviewed  ?CBC WITH DIFFERENTIAL/PLATELET - Abnormal; Notable for the following components:  ?    Result Value  ? Hemoglobin 11.9 (*)   ? HCT 35.2 (*)   ? All other components within normal limits  ?BASIC METABOLIC PANEL  ?TROPONIN I (HIGH SENSITIVITY)  ?TROPONIN I (HIGH SENSITIVITY)  ? ? ?EKG ?EKG Interpretation ? ?Date/Time:  Tuesday May 09 2021 12:35:05 EDT ?Ventricular Rate:  99 ?PR Interval:  134 ?QRS Duration: 74 ?QT Interval:  352 ?QTC Calculation: 451 ?R Axis:   25 ?Text Interpretation: Normal sinus rhythm When compared with ECG of 29-Sep-2020 16:41, PREVIOUS ECG IS PRESENT Confirmed by Lennice Sites [phone]) on 05/09/2021 12:39:13 PM ? ?Radiology ?CT Angio Chest PE W/Cm &/Or Wo Cm ? ?Result Date: 05/09/2021 ?CLINICAL DATA:  Chest pain EXAM: CT ANGIOGRAPHY CHEST WITH CONTRAST TECHNIQUE: Multidetector CT imaging of the chest was performed using the standard protocol during bolus administration of intravenous contrast. Multiplanar CT image reconstructions and MIPs were obtained to evaluate the vascular anatomy. RADIATION DOSE REDUCTION: This exam was performed according to the departmental dose-optimization program which includes automated exposure control,  adjustment of the mA and/or kV according to patient size and/or use of iterative reconstruction technique. CONTRAST:  128m OMNIPAQUE IOHEXOL 350 MG/ML SOLN COMPARISON:  CTA chest dated December 28, 2019 FINDINGS: Cardiovascular: Adequate contrast opacification of the pulmonary arteries. No evidence of pulmonary embolus. Normal heart size. No pericardial effusion. No significant vascular findings. Mediastinum/Nodes: Esophagus and thyroid are unremarkable. Stable previously described borderline enlarged left axillary lymph node measuring 1.1 cm, likely reactive. No pathologically enlarged lymph nodes seen in the chest. Lungs/Pleura: Central airways are patent. Mild bibasilar atelectasis. No consolidation, pleural effusion or pneumothorax. Upper Abdomen: No acute abnormality. Musculoskeletal: No chest wall abnormality. No acute or significant osseous findings. Review of the MIP images confirms the above  findings. IMPRESSION: No evidence of pulmonary embolus. Electronically Signed   By: Yetta Glassman M.D.   On: 05/09/2021 14:50   ? ?Procedures ?Procedures  ? ?Medications Ordered in ED ?Medications  ?oxyCODONE-acetaminophen (PERCOCET/ROXICET) 5-325 MG per tablet 1 tablet (1 tablet Oral Given 05/09/21 1336)  ?iohexol (OMNIPAQUE) 350 MG/ML injection 100 mL (100 mLs Intravenous Contrast Given 05/09/21 1429)  ? ? ?ED Course/ Medical Decision Making/ A&P ?  ?                        ?Medical Decision Making ?Amount and/or Complexity of Data Reviewed ?Labs: ordered. ?Radiology: ordered. ? ?Risk ?Prescription drug management. ? ?This patient presents to the ED for concern of neck and chest pain, this involves an extensive number of treatment options, and is a complaint that carries with it a high risk of complications and morbidity.  The differential diagnosis includes ACS, dissection, PE, PTX, pneumonia, myocarditis, pericarditis. Neck pain differential includes strain, fracture, torticollis, meningitis, etc.  ? ?Co  morbidities that complicate the patient evaluation ?DVT/PE ?Obesity ?Hypertension ? ?Additional history obtained:  ?Additional history obtained from: None ?External records from outside source obtained and reviewed includi

## 2021-05-09 NOTE — Progress Notes (Signed)
RNCM spoke with EDP Erskine Speed re: TOC consult for Eliquis. Per EDP patient has been prescribed Eliquis on the outpatient level of service and unable to afford medications. This RNCM advised patient can pick up $10 per month copay card or 30 day free trial card from [facility]. EDP will explain to patient and have her stop by [facility] pharmacy after d/c.  ? ?RNCM spoke with Dillion at [facility] to advise patient will come by to pick up the eliquis $10 discount card.  ? ? ?No additional TOC needs at this time.   If any additional needs please send new TOC consult. ?

## 2021-05-10 ENCOUNTER — Other Ambulatory Visit: Payer: Self-pay | Admitting: Family

## 2021-05-10 DIAGNOSIS — I2699 Other pulmonary embolism without acute cor pulmonale: Secondary | ICD-10-CM

## 2021-05-10 DIAGNOSIS — D6859 Other primary thrombophilia: Secondary | ICD-10-CM

## 2021-05-10 DIAGNOSIS — I82432 Acute embolism and thrombosis of left popliteal vein: Secondary | ICD-10-CM

## 2021-05-12 ENCOUNTER — Encounter: Payer: Self-pay | Admitting: Family

## 2021-05-12 ENCOUNTER — Inpatient Hospital Stay (HOSPITAL_BASED_OUTPATIENT_CLINIC_OR_DEPARTMENT_OTHER): Payer: Federal, State, Local not specified - PPO | Admitting: Family

## 2021-05-12 ENCOUNTER — Inpatient Hospital Stay: Payer: Federal, State, Local not specified - PPO | Attending: Hematology & Oncology

## 2021-05-12 VITALS — BP 109/88 | HR 87 | Temp 98.7°F | Resp 18 | Ht 69.0 in | Wt 334.4 lb

## 2021-05-12 DIAGNOSIS — I2699 Other pulmonary embolism without acute cor pulmonale: Secondary | ICD-10-CM

## 2021-05-12 DIAGNOSIS — I10 Essential (primary) hypertension: Secondary | ICD-10-CM

## 2021-05-12 DIAGNOSIS — Z86711 Personal history of pulmonary embolism: Secondary | ICD-10-CM | POA: Insufficient documentation

## 2021-05-12 DIAGNOSIS — Z86718 Personal history of other venous thrombosis and embolism: Secondary | ICD-10-CM | POA: Diagnosis present

## 2021-05-12 DIAGNOSIS — I82432 Acute embolism and thrombosis of left popliteal vein: Secondary | ICD-10-CM

## 2021-05-12 DIAGNOSIS — D6859 Other primary thrombophilia: Secondary | ICD-10-CM

## 2021-05-12 LAB — ANTITHROMBIN III: AntiThromb III Func: 106 % (ref 75–120)

## 2021-05-12 NOTE — Progress Notes (Signed)
Hematology/Oncology Consultation  ? ?Name: Meagan Hahn      MRN: [id number]    Location: Room/bed info not found  Date: 05/12/2021 Time:3:19 PM ? ? ?REFERRING PHYSICIAN: Theodis Blaze, PA-C ? ?REASON FOR CONSULT: history of PE and DVT ?  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  ?She states that she became symptomatic a couple weeks after her second dose of the Moderna Covid vaccine.  ?She is not currently taking the Eliquis as she didn't like how it made her feel and is wondering if she needs to take long term.  ?CT angio last week while in ED for chest pain was negative for PE.  ?No blood loss noted. No bruising or petechiae.  ?She notes fatigue and also dizziness if she stands too quickly.  ?She denies using birth control, hormone therapy or workout supplementation.  ?She does not smoke or use recreational drugs.  ?She has the occasional glass of wine socially.  ?She denies any recent traveling, surgery or injury.  ?She is in a vehicle part of the day for her job.  ?She has 2 children with no history of miscarriage. She states that she had 2 c-sections without any complications.  ?She states that she does not have a cycle and was told she is in early menopause.  ?She states that bother her maternal and paternal grandmothers were on anticoagulation and she thinks they may have also had history of thrombotic events.  ?No personal or known familial history of cancer.  ?No history of diabetes or thyroid disease.  ?No fever, chills, n/v, cough, rash, SOB, chest pain, palpitations, abdominal pain or changes in bowel or bladder habits.  ?No swelling, tenderness, numbness or tingling in her  extremities at this time.  ?No falls or syncope to report.  ?She has maintained a good appetite and is doing her best to stay well hydrated. Her weight is 334 lbs.  ?She is walking some for exercise.  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 ? ?ROS: All other 10 point review of systems is negative.  ? ?PAST MEDICAL HISTORY:   ?Past Medical History:  ?Diagnosis Date  ? GERD (gastroesophageal reflux disease)   ? Hypertension   ? Migraines   ? OSA (obstructive sleep apnea)   ? Pulmonary embolism ([place])   ? ? ?ALLERGIES: ?Allergies  ?Allergen Reactions  ? Ciprofloxacin Nausea And Vomiting  ? ?   ?MEDICATIONS:  ?Current Outpatient Medications on File Prior to Visit  ?Medication Sig Dispense Refill  ? apixaban (ELIQUIS) 5 MG TABS tablet Take 2 tablets (10 mg total) by mouth 2 (two) times daily. 28 tablet 0  ? apixaban (ELIQUIS) 5 MG TABS tablet Take 1 tablet (5 mg total) by mouth 2 (two) times daily. 60 tablet 1  ? losartan (COZAAR) 25 MG tablet Take 25 mg by mouth daily.    ? meloxicam (MOBIC) 15 MG tablet Take 15 mg by mouth daily.    ? omeprazole (PRILOSEC) 20 MG capsule Take 20 mg by mouth as needed (acid reflux).     ? topiramate (TOPAMAX) 25 MG tablet Take 50 mg by mouth at bedtime. INCREASE BY 1 WEEKLY TO 4  TABS AT BEDTIME    ? traMADol (ULTRAM) 50 MG tablet Take 1 tablet (50 mg total) by mouth every 6 (six) hours as needed for up to 3 days for severe pain. 12 tablet 0  ? valACYclovir (VALTREX) 500 MG tablet Take 500 mg by mouth daily.    ? ?No current facility-administered medications on file prior to visit.  ? ?  ?PAST SURGICAL HISTORY ?Past Surgical History:  ?Procedure Laterality Date  ? CESAREAN SECTION    ? CESAREAN SECTION    ? ? ?FAMILY HISTORY: ?Family History  ?Problem Relation Age of Onset  ? Migraines Mother   ? Stroke Maternal Grandmother   ? ? ?SOCIAL HISTORY: ? reports that she has never smoked. She has never used smokeless tobacco. She reports current alcohol use. She reports that she does not use  drugs. ? ?PERFORMANCE STATUS: ?The patient's performance status is 1 - Symptomatic but completely ambulatory ? ?PHYSICAL EXAM: ?Most Recent Vital Signs: There were no vitals taken for this visit. ?BP 109/88 (BP Location: Right Arm, Patient Position: Sitting)   Pulse 87   Temp 98.7 ?F (37.1 ?C) (Oral)   Resp 18   Ht '5\' 9"'$  (1.753 m)   Wt (!) 334 lb 6.4 oz (151.7 kg)   SpO2 100%   BMI 49.38 kg/m?  ? ?General Appearance:    Alert, cooperative, no distress, appears stated age  ?Head:    Normocephalic, without obvious abnormality, atraumatic  ?Eyes:    PERRL, conjunctiva/corneas clear, EOM's intact, fundi  ?  benign, both eyes  ?   ?   ?Throat:   Lips, mucosa, and tongue normal; teeth and gums normal  ?Neck:   Supple, symmetrical, trachea midline, no adenopathy;  ?  thyroid:  no enlargement/tenderness/nodules; no carotid ?  bruit or JVD  ?Back:     Symmetric, no curvature, ROM normal, no CVA tenderness  ?Lungs:     Clear to auscultation bilaterally, respirations unlabored  ?Chest Wall:    No tenderness or deformity  ? Heart:    Regular rate and rhythm, S1 and S2 normal, no murmur, rub   or gallop  ?   ?Abdomen:     Soft, non-tender, bowel sounds active all four quadrants,  ?  no masses, no organomegaly  ?   ?   ?Extremities:   Extremities normal, atraumatic, no cyanosis or edema  ?Pulses:   2+ and symmetric all extremities  ?Skin:   Skin color, texture, turgor normal, no rashes or lesions  ?Lymph nodes:   Cervical, supraclavicular, and axillary nodes normal  ?Neurologic:   CNII-XII intact, normal strength, sensation and reflexes  ?  throughout  ? ? ?LABORATORY DATA:  ?No results found for this or any previous visit (from the past 48 hour(s)).   ? ?RADIOGRAPHY: ?No results found.   ?  ?PATHOLOGY: None ? 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  ?She stopped taking her Eliquis and is wondering if she truly needs to take long term.  ?We will see what her hyper coag studies reveal if long term anticoagulation is advised as well as follow-up.  ? ?All questions were answered. The patient knows to call the clinic with any problems, questions or concerns. We can certainly see the patient much sooner if necessary. ? ?  The patient was discussed with Dr. Marin Olp and he is in agreement with the aforementioned.  ? ?Lottie Dawson, NP ?  ? ? ? ? ? ?

## 2021-05-13 LAB — HOMOCYSTEINE: Homocysteine: 9.7 umol/L (ref 0.0–14.5)

## 2021-05-14 LAB — PROTEIN S, TOTAL: Protein S Ag, Total: 102 % (ref 60–150)

## 2021-05-14 LAB — CARDIOLIPIN ANTIBODIES, IGG, IGM, IGA
Anticardiolipin IgA: <9 APL U/mL (ref 0–11)
Anticardiolipin IgG: <9 GPL U/mL (ref 0–14)
Anticardiolipin IgM: <9 MPL U/mL (ref 0–12)

## 2021-05-14 LAB — PROTEIN C ACTIVITY: Protein C Activity: 119 % (ref 73–180)

## 2021-05-14 LAB — BETA-2-GLYCOPROTEIN I ABS, IGG/M/A
Beta-2 Glyco I IgG: <9 GPI IgG units (ref 0–20)
Beta-2-Glycoprotein I IgA: <9 GPI IgA units (ref 0–25)
Beta-2-Glycoprotein I IgM: <9 GPI IgM units (ref 0–32)

## 2021-05-14 LAB — LUPUS ANTICOAGULANT PANEL
DRVVT: 40.1 s (ref 0.0–47.0)
PTT Lupus Anticoagulant: 37.9 s (ref 0.0–43.5)

## 2021-05-14 LAB — PROTEIN S ACTIVITY: Protein S Activity: 72 % (ref 63–140)

## 2021-05-14 LAB — PROTEIN C, TOTAL: Protein C, Total: 95 % (ref 60–150)

## 2021-05-19 LAB — FACTOR 5 LEIDEN

## 2021-05-19 LAB — PROTHROMBIN GENE MUTATION

## 2021-06-02 ENCOUNTER — Telehealth: Payer: Self-pay | Admitting: Family

## 2021-06-02 NOTE — Telephone Encounter (Signed)
Hyper coag panel was negative. I went over with patient after discussing with Dr. Marin Olp. She can stop the Eliquis. No follow-up needed. No questions or concerns at this time. We can certainly see her again for any future heme onc issues. Patient appreciative of call.  ?

## 2022-04-16 ENCOUNTER — Emergency Department (HOSPITAL_BASED_OUTPATIENT_CLINIC_OR_DEPARTMENT_OTHER): Payer: Federal, State, Local not specified - PPO

## 2022-04-16 ENCOUNTER — Encounter (HOSPITAL_BASED_OUTPATIENT_CLINIC_OR_DEPARTMENT_OTHER): Payer: Self-pay | Admitting: Urology

## 2022-04-16 ENCOUNTER — Other Ambulatory Visit: Payer: Self-pay

## 2022-04-16 ENCOUNTER — Emergency Department (HOSPITAL_BASED_OUTPATIENT_CLINIC_OR_DEPARTMENT_OTHER)
Admission: EM | Admit: 2022-04-16 | Discharge: 2022-04-16 | Disposition: A | Discharge status: Home / Self Care | Payer: Federal, State, Local not specified - PPO | Attending: Emergency Medicine | Admitting: Emergency Medicine

## 2022-04-16 DIAGNOSIS — M79605 Pain in left leg: Secondary | ICD-10-CM | POA: Insufficient documentation

## 2022-04-16 DIAGNOSIS — R1032 Left lower quadrant pain: Secondary | ICD-10-CM | POA: Diagnosis present

## 2022-04-16 DIAGNOSIS — Z79899 Other long term (current) drug therapy: Secondary | ICD-10-CM | POA: Insufficient documentation

## 2022-04-16 DIAGNOSIS — N939 Abnormal uterine and vaginal bleeding, unspecified: Secondary | ICD-10-CM | POA: Diagnosis not present

## 2022-04-16 DIAGNOSIS — Z7901 Long term (current) use of anticoagulants: Secondary | ICD-10-CM | POA: Diagnosis not present

## 2022-04-16 DIAGNOSIS — I10 Essential (primary) hypertension: Secondary | ICD-10-CM | POA: Insufficient documentation

## 2022-04-16 DIAGNOSIS — N3001 Acute cystitis with hematuria: Secondary | ICD-10-CM | POA: Insufficient documentation

## 2022-04-16 LAB — CBC
HCT: 32.9 % — ABNORMAL LOW (ref 36.0–46.0)
Hemoglobin: 10.8 g/dL — ABNORMAL LOW (ref 12.0–15.0)
MCH: 28.4 pg (ref 26.0–34.0)
MCHC: 32.8 g/dL (ref 30.0–36.0)
MCV: 86.6 fL (ref 80.0–100.0)
Platelets: 228 10*3/uL (ref 150–400)
RBC: 3.8 MIL/uL — ABNORMAL LOW (ref 3.87–5.11)
RDW: 13 % (ref 11.5–15.5)
WBC: 5.2 10*3/uL (ref 4.0–10.5)
nRBC: 0 % (ref 0.0–0.2)

## 2022-04-16 LAB — BASIC METABOLIC PANEL
Anion gap: 7 (ref 5–15)
BUN: 14 mg/dL (ref 6–20)
CO2: 23 mmol/L (ref 22–32)
Calcium: 8.6 mg/dL — ABNORMAL LOW (ref 8.9–10.3)
Chloride: 107 mmol/L (ref 98–111)
Creatinine, Ser: 0.94 mg/dL (ref 0.44–1.00)
GFR, Estimated: >60 mL/min (ref >60)
Glucose, Bld: 93 mg/dL (ref 70–99)
Potassium: 4.1 mmol/L (ref 3.5–5.1)
Sodium: 137 mmol/L (ref 135–145)

## 2022-04-16 LAB — URINALYSIS, ROUTINE W REFLEX MICROSCOPIC
Glucose, UA: NEGATIVE mg/dL
Ketones, ur: NEGATIVE mg/dL
Leukocytes,Ua: NEGATIVE
Nitrite: POSITIVE — AB
Protein, ur: 100 mg/dL — AB
Specific Gravity, Urine: >=1.03 (ref 1.005–1.030)
pH: 5.5 (ref 5.0–8.0)

## 2022-04-16 LAB — URINALYSIS, MICROSCOPIC (REFLEX): RBC / HPF: >50 RBC/hpf (ref 0–5)

## 2022-04-16 LAB — HCG, SERUM, QUALITATIVE: Preg, Serum: NEGATIVE

## 2022-04-16 MED ORDER — CEPHALEXIN 250 MG PO CAPS: 250.0000 mg | ORAL_CAPSULE | Freq: Four times a day (QID) | ORAL | 0 refills | Status: AC

## 2022-04-16 MED ORDER — OXYCODONE-ACETAMINOPHEN 5-325 MG PO TABS
1.0000 | ORAL_TABLET | Freq: Once | ORAL | Status: AC
  Administered 2022-04-16: 1 via ORAL
  Filled 2022-04-16: qty 1

## 2022-04-16 MED ORDER — OXYCODONE-ACETAMINOPHEN 5-325 MG PO TABS: 1.0000 | ORAL_TABLET | Freq: Four times a day (QID) | ORAL | 0 refills | Status: AC | PRN

## 2022-04-16 NOTE — Discharge Instructions (Addendum)
Please take your medications as prescribed. Take tylenol/ibuprofen or Percocet as needed for pain. I recommend close follow-up with PCP for reevaluation.  Please do not hesitate to return to emergency department if worrisome signs symptoms we discussed become apparent.  

## 2022-04-16 NOTE — ED Triage Notes (Signed)
Pt started having vaginal bleeding on 3/16,  Bleeding continued but only when sitting to use the bathroom  States heavy with small clots  States pelvic cramping as well   Spoke with OB/GYN and told to come here, has appointment on 4/1

## 2022-04-16 NOTE — ED Provider Notes (Signed)
[facility] Provider Note   CSN: [id number] Arrival date & time: 04/16/22  1007     History  Chief Complaint  Patient presents with   Vaginal Bleeding    [facility] Hahn is a 42 y.o. 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  Patient states she has had vaginal bleeding since 3/13.  States she does not use pads but every time she goes to restroom she will see blood in the toilet.  States she also noticed blood in her urine.  She reports left lower quadrant pain that radiates to her left leg.  She denies any fever.  States she is in her early menopause LMP was last year.   Vaginal Bleeding     Past Medical History:  Diagnosis Date   GERD (gastroesophageal reflux disease)    Hypertension    Migraines    OSA (obstructive sleep apnea)    Pulmonary embolism (HCC)    Past Surgical History:  Procedure Laterality Date   CESAREAN SECTION     CESAREAN SECTION       Home Medications Prior to Admission medications   Medication Sig Start Date End Date Taking? Authorizing Provider  cephALEXin (KEFLEX) 250 MG capsule Take 1 capsule (250 mg total) by mouth 4 (four) times daily. 04/16/22  Yes Rex Kras, PA  oxyCODONE-acetaminophen (PERCOCET/ROXICET) 5-325 MG tablet Take 1 tablet by mouth every 6 (six) hours as needed for up to 8 doses for severe pain. 04/16/22  Yes Rex Kras, PA  apixaban (ELIQUIS) 5 MG TABS tablet Take 2 tablets (10 mg total) by mouth 2 (two) times daily. Patient not taking: Reported on 05/12/2021 07/31/19   Shelly Coss, MD  apixaban (ELIQUIS) 5 MG TABS tablet Take 1 tablet (5 mg total) by mouth 2 (two) times daily. Patient not taking: Reported on 05/12/2021 08/07/19   Shelly Coss, MD  losartan (COZAAR) 25 MG tablet Take 25 mg by mouth daily. 06/23/19   [provider]  meloxicam (MOBIC) 15 MG tablet Take 15 mg by  mouth daily. Patient not taking: Reported on 05/12/2021 07/16/19   [provider]  omeprazole (PRILOSEC) 20 MG capsule Take 20 mg by mouth as needed (acid reflux).  Patient not taking: Reported on 05/12/2021 06/23/19   [provider]  topiramate (TOPAMAX) 25 MG tablet Take 50 mg by mouth at bedtime. INCREASE BY 1 WEEKLY TO 4 TABS AT BEDTIME 04/08/19   [provider]  valACYclovir (VALTREX) 500 MG tablet Take 500 mg by mouth daily.    [provider]      Allergies    Ciprofloxacin    Review of Systems   Review of Systems  Genitourinary:  Positive for vaginal bleeding.    Physical Exam Updated Vital Signs BP 129/77   Pulse (!) 57   Temp 99.7 F (37.6 C) (Oral)   Resp 17   Ht 5\' 9"  (1.753 m)   Wt (!) 151.7 kg   SpO2 100%   BMI 49.39 kg/m  Physical Exam Vitals and nursing note reviewed.  Constitutional:      Appearance: Normal appearance.  HENT:     Head: Normocephalic and atraumatic.     Mouth/Throat:     Mouth: Mucous membranes are moist.  Eyes:     General: No scleral icterus. Cardiovascular:     Rate and Rhythm: Normal rate and regular rhythm.  Pulses: Normal pulses.     Heart sounds: Normal heart sounds.  Pulmonary:     Effort: Pulmonary effort is normal.     Breath sounds: Normal breath sounds.  Abdominal:     General: Abdomen is flat.     Palpations: Abdomen is soft.     Tenderness: There is abdominal tenderness in the left lower quadrant.  Musculoskeletal:        General: No deformity.     Comments: TTP to L calf.  Skin:    General: Skin is warm.     Findings: No rash.  Neurological:     General: No focal deficit present.     Mental Status: She is alert.  Psychiatric:        Mood and Affect: Mood normal.     ED Results / Procedures / Treatments   Labs (all labs ordered are listed, but only abnormal results are displayed) Labs Reviewed  CBC - Abnormal; Notable for the following components:      Result Value    RBC 3.80 (*)    Hemoglobin 10.8 (*)    HCT 32.9 (*)    All other components within normal limits  BASIC METABOLIC PANEL - Abnormal; Notable for the following components:   Calcium 8.6 (*)    All other components within normal limits  URINALYSIS, ROUTINE W REFLEX MICROSCOPIC - Abnormal; Notable for the following components:   Color, Urine RED (*)    APPearance CLOUDY (*)    Hgb urine dipstick LARGE (*)    Bilirubin Urine SMALL (*)    Protein, ur 100 (*)    Nitrite POSITIVE (*)    All other components within normal limits  URINALYSIS, MICROSCOPIC (REFLEX) - Abnormal; Notable for the following components:   Bacteria, UA MANY (*)    All other components within normal limits  HCG, SERUM, QUALITATIVE    EKG None  Radiology US Venous Img Lower Unilateral Left  Result Date: 04/16/2022 CLINICAL DATA:  Left leg pain EXAM: LEFT LOWER EXTREMITY VENOUS DOPPLER ULTRASOUND TECHNIQUE: Gray-scale sonography with compression, as well as color and duplex ultrasound, were performed to evaluate the deep venous system(s) from the level of the common femoral vein through the popliteal and proximal calf veins. COMPARISON:  Left lower extremity Doppler 07/30/2019 FINDINGS: VENOUS Normal compressibility of the common femoral, superficial femoral, and popliteal veins, as well as the visualized calf veins. Visualized portions of profunda femoral vein and great saphenous vein unremarkable. No filling defects to suggest DVT on grayscale or color Doppler imaging. Doppler waveforms show normal direction of venous flow, normal respiratory plasticity and response to augmentation. Limited views of the contralateral common femoral vein are unremarkable. OTHER None. Limitations: none IMPRESSION: Negative. Electronically Signed   By: Valetta Mole M.D.   On: 04/16/2022 15:05   CT ABDOMEN PELVIS WO CONTRAST  Result Date: 04/16/2022 CLINICAL DATA:  Abdominal pain, hematuria EXAM: CT ABDOMEN AND PELVIS WITHOUT CONTRAST  TECHNIQUE: Multidetector CT imaging of the abdomen and pelvis was performed following the standard protocol without IV contrast. RADIATION DOSE REDUCTION: This exam was performed according to the departmental dose-optimization program which includes automated exposure control, adjustment of the mA and/or kV according to patient size and/or use of iterative reconstruction technique. COMPARISON:  None Available. FINDINGS: Lower chest: No acute abnormality. Evaluation abdominal viscera limited by the lack of IV contrast. Hepatobiliary: No focal liver abnormality is seen. Normal appearance of the gallbladder. Pancreas: Unremarkable. No surrounding inflammatory changes. Spleen: Normal in size  without focal abnormality. Adrenals/Urinary Tract: Adrenal glands are unremarkable. Kidneys are symmetric in size. No renal calculi or hydronephrosis. Stomach/Bowel: Stomach is within normal limits. Appendix appears normal. No evidence of bowel wall thickening, distention, or inflammatory changes. Vascular/Lymphatic: No enlarged abdominal or pelvic lymph nodes. Reproductive: Uterus and bilateral adnexa are unremarkable. Other: No abdominal wall hernia or abnormality. No abdominopelvic ascites. Musculoskeletal: No acute or significant osseous findings. IMPRESSION: The no acute findings in the abdomen or pelvis on a noncontrast exam. No renal calculi or hydronephrosis. Electronically Signed   By: Audie Pinto M.D.   On: 04/16/2022 14:16   US PELVIC COMPLETE W TRANSVAGINAL AND TORSION R/O  Result Date: 04/16/2022 CLINICAL DATA:  [id number] Vaginal bleeding [id number] EXAM: TRANSABDOMINAL AND TRANSVAGINAL ULTRASOUND OF PELVIS DOPPLER ULTRASOUND OF OVARIES TECHNIQUE: Both transabdominal and transvaginal ultrasound examinations of the pelvis were performed. Transabdominal technique was performed for global imaging of the pelvis including uterus, ovaries, adnexal regions, and pelvic cul-de-sac. It was necessary to proceed with endovaginal  exam following the transabdominal exam to visualize the uterus/endometrium and ovaries/adnexa. Color and duplex Doppler ultrasound was utilized to evaluate blood flow to the ovaries. COMPARISON:  Ultrasound 12/01/2010 FINDINGS: Uterus Measurements: 8.2 x 3.6 x 4.8 cm = volume: 75.4 mL. There is a 1.6 x 0.8 x 1.2 cm hypoechoic uterine fibroid. Endometrium Thickness: 7.6 mm.  This can be normal for age. Right ovary Not visualized. Left ovary Measurements: 1.8 x 1.8 x 1.5 cm = volume: 2.6 mL. Normal appearance/no adnexal mass. Pulsed Doppler evaluation of in the left ovary demonstrates normal low-resistance arterial and venous waveforms. Other findings Trace, nonspecific free fluid. IMPRESSION: No acute sonographic abnormality identified in the pelvis. Right ovary is not visualized. Trace, nonspecific free fluid, likely physiologic. Electronically Signed   By: Maurine Simmering M.D.   On: 04/16/2022 12:55    Procedures Procedures    Medications Ordered in ED Medications  oxyCODONE-acetaminophen (PERCOCET/ROXICET) 5-325 MG per tablet 1 tablet (1 tablet Oral Given 04/16/22 1346)    ED Course/ Medical Decision Making/ A&P                             Medical Decision Making Amount and/or Complexity of Data Reviewed Labs: ordered. Radiology: ordered.  Risk Prescription drug management.   This patient presents to the ED for vaginal bleeding, L leg pain, this involves an extensive number of treatment options, and is a complaint that carries with a high risk of complications and morbidity.  The differential diagnosis includes UTI, STD, PID/TOA, endometriosis, uterine fibroid, polyps, coagulopathy, laceration, trauma, ectopic pregnancy, ovarian torsion, infectious etiology.  This is not an exhaustive list.  Lab tests: I ordered and personally interpreted labs.  The pertinent results include: WBC unremarkable. Hbg unremarkable. Platelets unremarkable. Electrolytes unremarkable. BUN, creatinine unremarkable.  UA positive for nitrite  Imaging studies: I ordered imaging studies. I personally reviewed, interpreted imaging and agree with the radiologist's interpretations. The results include: Pelvic ultrasound, left lower leg ultrasound, CT abdomen pelvis negative.  Problem list/ ED course/ Critical interventions/ Medical management: HPI: See above Vital signs within normal range and stable throughout visit. Laboratory/imaging studies significant for: See above. On physical examination, patient is afebrile and appears in no acute distress. Patient presents with vaginal bleeding likely secondary to fibroids or other non-emergent cause of abnormal uterine bleeding such as anovulatory cycle. Based on History, Exam, and ED workup patient's presentation not consistent with ectopic pregnancy, molar pregnancy, life-threatening coagulopathy,  trauma, serious bacterial infection. Patient will follow up with OBGYN.  OCP for bleeding control is deferred at this point due to history of DVT and PE.  Patient also reports left calf pain which is similar to her DVT in the past.  Ultrasound of left lower extremity order showed no evidence of DVT.  Patient also presents with left lower quadrant pain.  Low suspicion for renal stones, diverticulosis, diverticulitis, appendicitis based on CT scan results.  Urinalysis did show evidence of UTI.  I sent an order of Keflex. Advised patient to take Tylenol/ibuprofen/naproxen for pain, follow-up with primary care physician and OB-GYN for further evaluation and management, return to the ER if new or worsening symptoms.  I have reviewed the patient home medicines and have made adjustments as needed.  Cardiac monitoring/EKG: The patient was maintained on a cardiac monitor.  I personally reviewed and interpreted the cardiac monitor which showed an underlying rhythm of: sinus rhythm.  Additional history obtained: External records from outside source obtained and reviewed including: Chart  review including previous notes, labs, imaging.  Consultations obtained:  Disposition Continued outpatient therapy. Follow-up with PCP recommended for reevaluation of symptoms. Treatment plan discussed with patient.  Pt acknowledged understanding was agreeable to the plan. Worrisome signs and symptoms were discussed with patient, and patient acknowledged understanding to return to the ED if they noticed these signs and symptoms. Patient was stable upon discharge.   This chart was dictated using voice recognition software.  Despite best efforts to proofread,  errors can occur which can change the documentation meaning.          Final Clinical Impression(s) / ED Diagnoses Final diagnoses:  Vaginal bleeding  Left lower quadrant abdominal pain  Acute cystitis with hematuria    Rx / DC Orders ED Discharge Orders          Ordered    cephALEXin (KEFLEX) 250 MG capsule  4 times daily        04/16/22 1526    oxyCODONE-acetaminophen (PERCOCET/ROXICET) 5-325 MG tablet  Every 6 hours PRN        04/16/22 1526              Rex Kras, [place] 04/16/22 1547    Long, [facility], MD 04/19/22 [id number]

## 2022-04-30 IMAGING — CT CT ANGIO CHEST
2 of 10 series · 18 of 36 positions shown · IV contrast (Omnipaque)
Comparison: CTA chest dated December 28, 2019

CLINICAL DATA: Chest pain

EXAM:
CT ANGIOGRAPHY CHEST WITH CONTRAST
TECHNIQUE: Multidetector CT imaging of the chest was performed using the
standard protocol during bolus administration of intravenous
contrast. Multiplanar CT image reconstructions and MIPs were
obtained to evaluate the vascular anatomy.

[Series 8: pe thins · axial · 0.69mm/px · z∈[-299,-40]mm · 17 of 293 slices shown]
[im 17/293  lung]
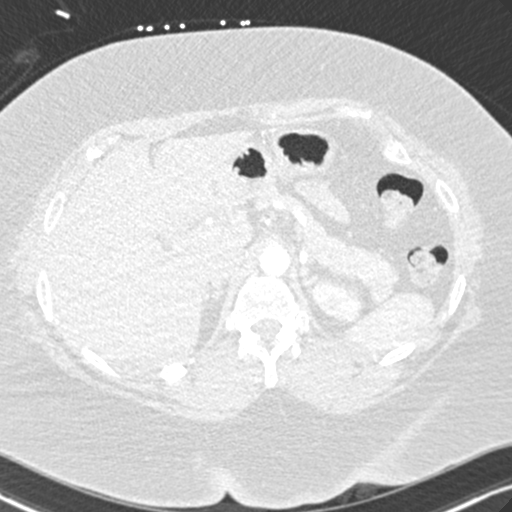
[im 33/293  mediastinal]
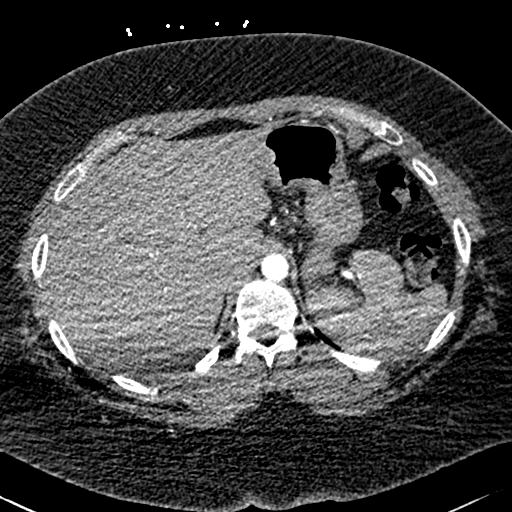
[im 49/293  lung]
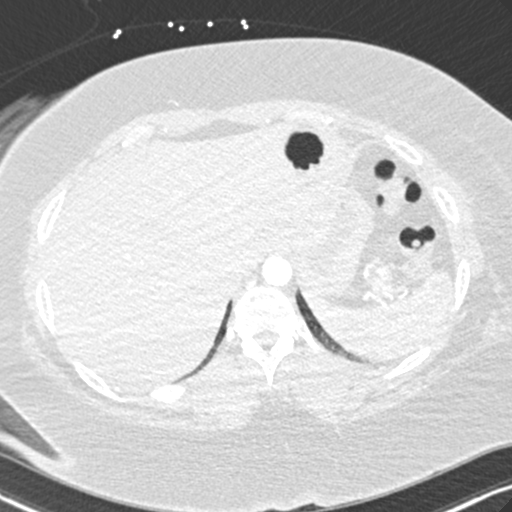
[im 65/293  mediastinal]
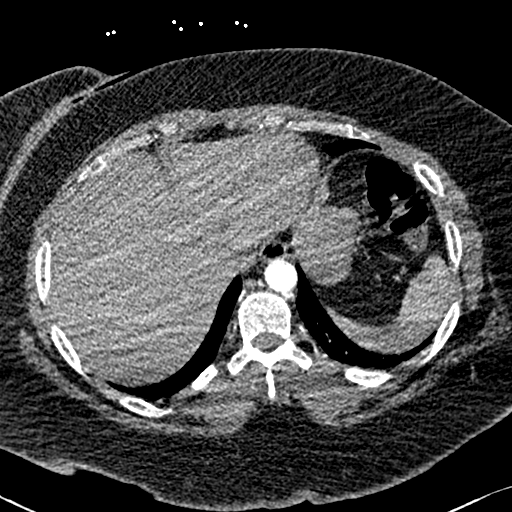
[im 82/293  lung]
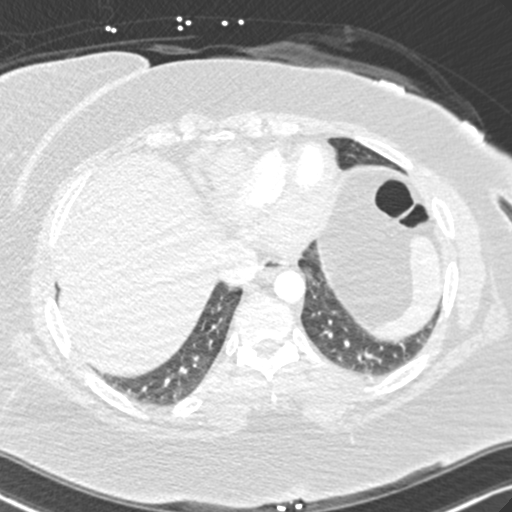
[im 98/293  mediastinal]
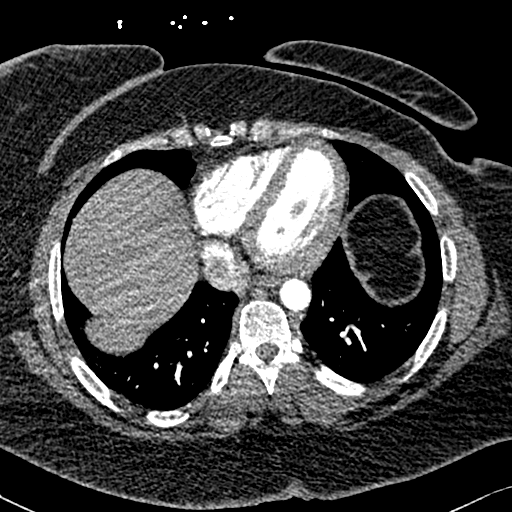
[im 114/293  lung]
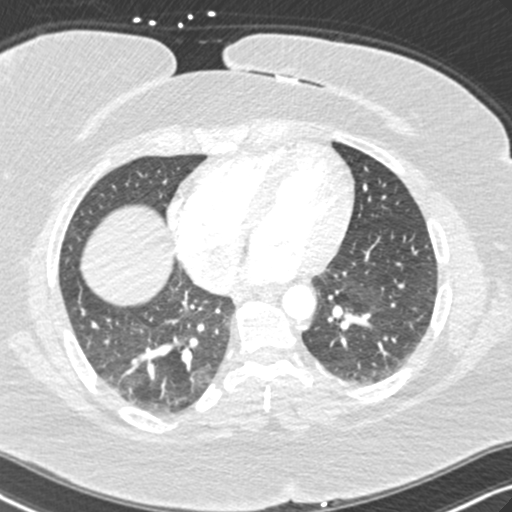
[im 130/293  mediastinal]
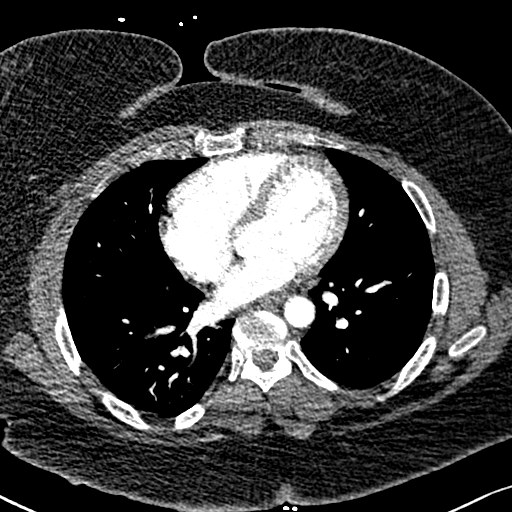
[im 147/293  lung]
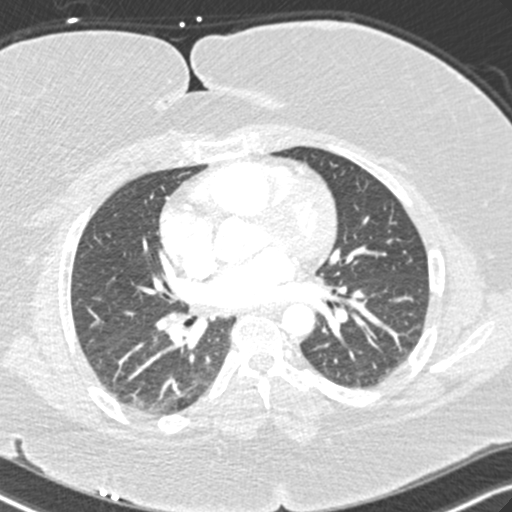
[im 163/293  mediastinal]
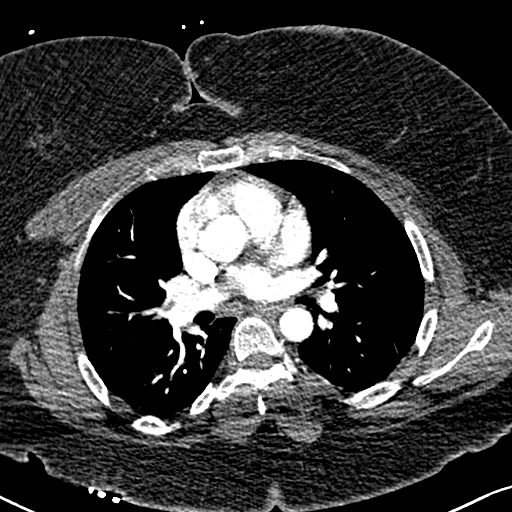
[im 179/293  lung]
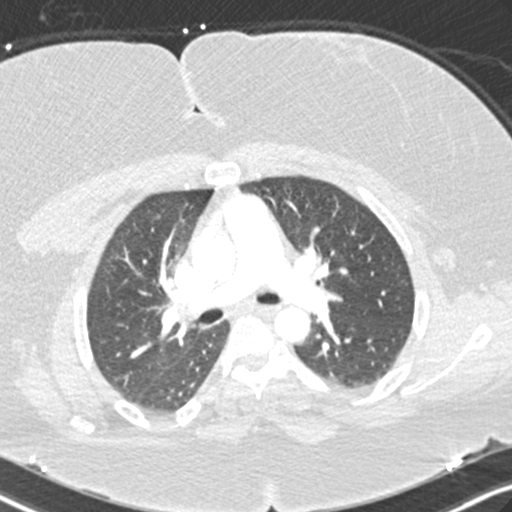
[im 195/293  mediastinal]
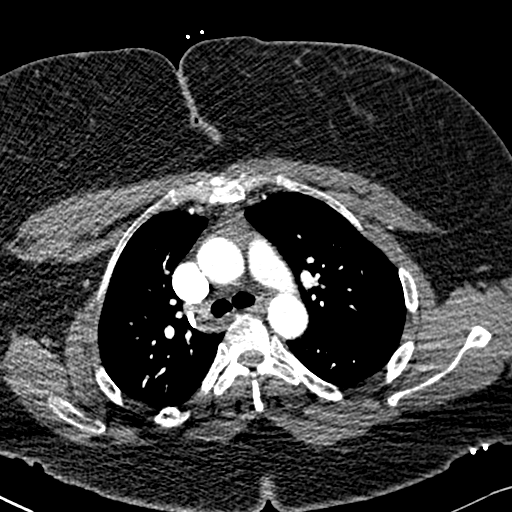
[im 211/293  lung]
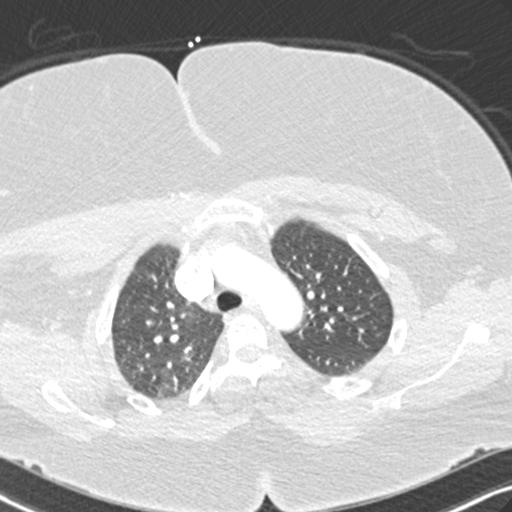
[im 228/293  mediastinal]
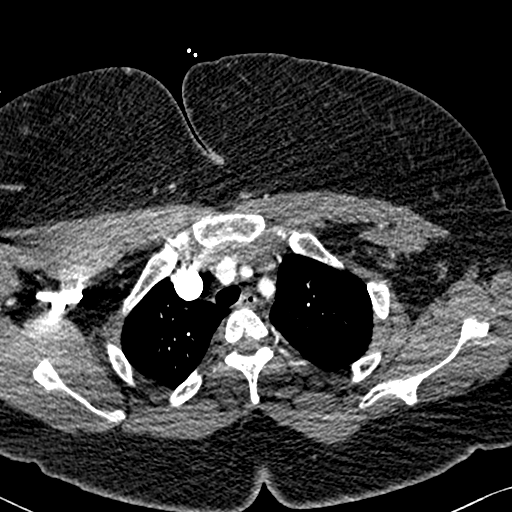
[im 244/293  lung]
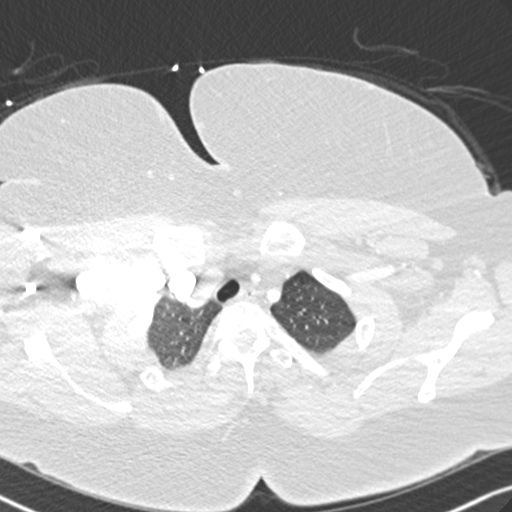
[im 260/293  mediastinal]
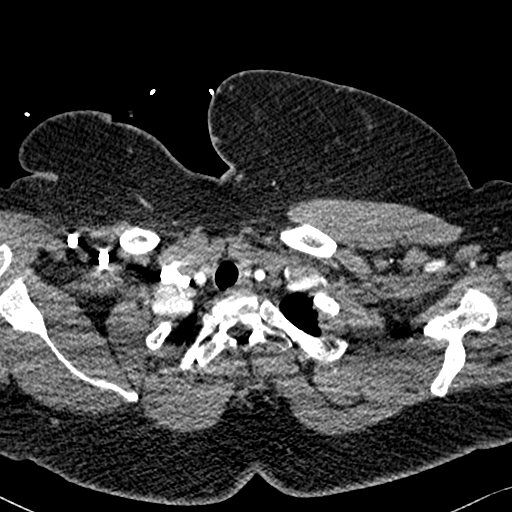
[im 276/293  lung]
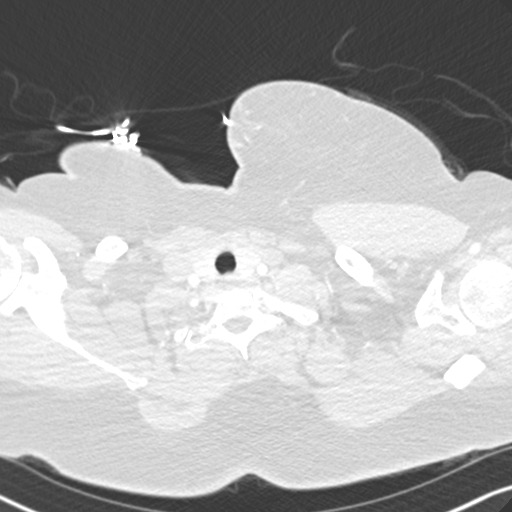

[Series 9: pe coronal mpr · coronal · 0.60mm/px · 1 of 125 slices shown]
[im 63/125  mediastinal]
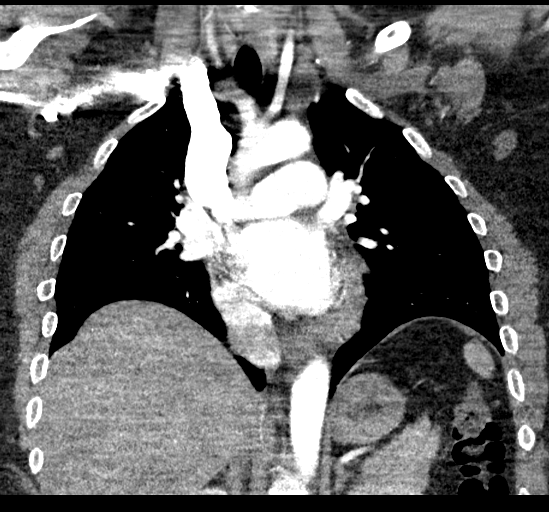

[18 of 36 positions shown; findings below may reference images not displayed]

RADIATION DOSE REDUCTION: This exam was performed according to the
departmental dose-optimization program which includes automated
exposure control, adjustment of the mA and/or kV according to
patient size and/or use of iterative reconstruction technique.

CONTRAST:  100mL OMNIPAQUE IOHEXOL 350 MG/ML SOLN
FINDINGS: Cardiovascular: Adequate contrast opacification of the pulmonary
arteries. No evidence of pulmonary embolus. Normal heart size. No
pericardial effusion. No significant vascular findings.

Mediastinum/Nodes: Esophagus and thyroid are unremarkable. Stable
previously described borderline enlarged left axillary lymph node
measuring 1.1 cm, likely reactive. No pathologically enlarged lymph
nodes seen in the chest.

Lungs/Pleura: Central airways are patent. Mild bibasilar
atelectasis. No consolidation, pleural effusion or pneumothorax.

Upper Abdomen: No acute abnormality.

Musculoskeletal: No chest wall abnormality. No acute or significant
osseous findings.

Review of the MIP images confirms the above findings.
IMPRESSION: No evidence of pulmonary embolus.

## 2023-01-05 ENCOUNTER — Encounter (HOSPITAL_BASED_OUTPATIENT_CLINIC_OR_DEPARTMENT_OTHER): Payer: Self-pay

## 2023-01-05 ENCOUNTER — Other Ambulatory Visit: Payer: Self-pay

## 2023-01-05 ENCOUNTER — Emergency Department (HOSPITAL_BASED_OUTPATIENT_CLINIC_OR_DEPARTMENT_OTHER): Payer: Federal, State, Local not specified - PPO

## 2023-01-05 ENCOUNTER — Emergency Department (HOSPITAL_BASED_OUTPATIENT_CLINIC_OR_DEPARTMENT_OTHER)
Admission: EM | Admit: 2023-01-05 | Discharge: 2023-01-05 | Disposition: A | Discharge status: Home / Self Care | Payer: Federal, State, Local not specified - PPO | Attending: Emergency Medicine | Admitting: Emergency Medicine

## 2023-01-05 DIAGNOSIS — R0789 Other chest pain: Secondary | ICD-10-CM | POA: Diagnosis not present

## 2023-01-05 DIAGNOSIS — I10 Essential (primary) hypertension: Secondary | ICD-10-CM | POA: Insufficient documentation

## 2023-01-05 DIAGNOSIS — M79605 Pain in left leg: Secondary | ICD-10-CM | POA: Insufficient documentation

## 2023-01-05 DIAGNOSIS — Z7901 Long term (current) use of anticoagulants: Secondary | ICD-10-CM | POA: Insufficient documentation

## 2023-01-05 DIAGNOSIS — Z79899 Other long term (current) drug therapy: Secondary | ICD-10-CM | POA: Diagnosis not present

## 2023-01-05 DIAGNOSIS — M79604 Pain in right leg: Secondary | ICD-10-CM | POA: Insufficient documentation

## 2023-01-05 DIAGNOSIS — R079 Chest pain, unspecified: Secondary | ICD-10-CM | POA: Diagnosis present

## 2023-01-05 LAB — CBC
HCT: 37.4 % (ref 36.0–46.0)
Hemoglobin: 12.2 g/dL (ref 12.0–15.0)
MCH: 28.2 pg (ref 26.0–34.0)
MCHC: 32.6 g/dL (ref 30.0–36.0)
MCV: 86.6 fL (ref 80.0–100.0)
Platelets: 273 10*3/uL (ref 150–400)
RBC: 4.32 MIL/uL (ref 3.87–5.11)
RDW: 12.9 % (ref 11.5–15.5)
WBC: 6.6 10*3/uL (ref 4.0–10.5)
nRBC: 0 % (ref 0.0–0.2)

## 2023-01-05 LAB — BASIC METABOLIC PANEL
Anion gap: 7 (ref 5–15)
BUN: 15 mg/dL (ref 6–20)
CO2: 25 mmol/L (ref 22–32)
Calcium: 9 mg/dL (ref 8.9–10.3)
Chloride: 106 mmol/L (ref 98–111)
Creatinine, Ser: 0.96 mg/dL (ref 0.44–1.00)
GFR, Estimated: >60 mL/min (ref >60)
Glucose, Bld: 95 mg/dL (ref 70–99)
Potassium: 4 mmol/L (ref 3.5–5.1)
Sodium: 138 mmol/L (ref 135–145)

## 2023-01-05 LAB — TROPONIN I (HIGH SENSITIVITY)
Troponin I (High Sensitivity): 2 ng/L (ref <18)
Troponin I (High Sensitivity): 2 ng/L (ref <18)

## 2023-01-05 MED ORDER — IOHEXOL 350 MG/ML SOLN
75.0000 mL | Freq: Once | INTRAVENOUS | Status: AC | PRN
  Administered 2023-01-05: 75 mL via INTRAVENOUS

## 2023-01-05 NOTE — Discharge Instructions (Addendum)
Pleasure taking care of you today.  You were evaluated in the emergency room for leg pain and chest pain your lab work was entirely normal.  The ultrasounds of your leg did not show a clot but did show inflammation of the vein, called thrombophlebitis.  The CT of your chest did not show pulmonary embolism.  Please follow-up with your primary care provider later this week.  If you experience any new or worsening symptoms including worsening chest pain, difficulty breathing, leg swelling and pain please return to the emergency room.

## 2023-01-05 NOTE — ED Triage Notes (Signed)
The patient having chest pain since Monday. She has  a hx of DVT is is concerned she has them now. She is also having shortness of breath.

## 2023-01-05 NOTE — ED Notes (Signed)
IV Removed  

## 2023-01-05 NOTE — ED Provider Notes (Signed)
[place] [facility] AT [facility] Provider Note   CSN: [id number] Arrival date & time: 01/05/23  4098     History  Chief Complaint  Patient presents with   Chest Pain    Meagan Hahn is a 42 y.o. female with history of DVT and PE, no longer on [facility] presents with complaints of bilateral leg pain, chest pain, shortness of breath.  She states her right leg started hurting about 2 weeks ago and then in the past week she started to experience chest pain with associated shortness of breath.  She states now her left leg hurts as well.  She is concerned that she is having recurrent blood clot.  No recent surgery or extended travel.  She is not on birth control.    Chest Pain  Past Medical History:  Diagnosis Date   GERD (gastroesophageal reflux disease)    Hypertension    Migraines    OSA (obstructive sleep apnea)    Pulmonary embolism (HCC)        Home Medications Prior to Admission medications   Medication Sig Start Date End Date Taking? Authorizing Provider  apixaban (ELIQUIS) 5 MG TABS tablet Take 2 tablets (10 mg total) by mouth 2 (two) times daily. Patient not taking: Reported on 05/12/2021 07/31/19   Burnadette Pop, MD  apixaban (ELIQUIS) 5 MG TABS tablet Take 1 tablet (5 mg total) by mouth 2 (two) times daily. Patient not taking: Reported on 05/12/2021 08/07/19   Burnadette Pop, MD  cephALEXin (KEFLEX) 250 MG capsule Take 1 capsule (250 mg total) by mouth 4 (four) times daily. 04/16/22   Jeanelle Malling, PA  losartan (COZAAR) 25 MG tablet Take 25 mg by mouth daily. 06/23/19   [provider]  meloxicam (MOBIC) 15 MG tablet Take 15 mg by mouth daily. Patient not taking: Reported on 05/12/2021 07/16/19   [provider]  omeprazole (PRILOSEC) 20 MG capsule Take 20 mg by mouth as needed (acid reflux).  Patient not taking: Reported on 05/12/2021 06/23/19   [provider]  oxyCODONE-acetaminophen (PERCOCET/ROXICET) 5-325 MG tablet Take 1  tablet by mouth every 6 (six) hours as needed for up to 8 doses for severe pain. 04/16/22   Jeanelle Malling, PA  topiramate (TOPAMAX) 25 MG tablet Take 50 mg by mouth at bedtime. INCREASE BY 1 WEEKLY TO 4 TABS AT BEDTIME 04/08/19   [provider]  valACYclovir (VALTREX) 500 MG tablet Take 500 mg by mouth daily.    [provider]      Allergies    Ciprofloxacin    Review of Systems   Review of Systems  Cardiovascular:  Positive for chest pain.    Physical Exam Updated Vital Signs BP 127/85   Pulse 79   Temp 98 F (36.7 C)   Resp 16   Ht 5\' 10"  (1.778 m)   Wt (!) 166.5 kg   LMP 01/16/2019   SpO2 100%   BMI 52.67 kg/m  Physical Exam Vitals and nursing note reviewed.  Constitutional:      General: She is not in acute distress.    Appearance: She is well-developed.  HENT:     Head: Normocephalic and atraumatic.  Eyes:     Conjunctiva/sclera: Conjunctivae normal.  Cardiovascular:     Rate and Rhythm: Normal rate and regular rhythm.     Heart sounds: No murmur heard. Pulmonary:     Effort: Pulmonary effort is normal. No respiratory distress.     Breath sounds: Normal  breath sounds.  Chest:     Chest wall: Tenderness present.  Abdominal:     Palpations: Abdomen is soft.     Tenderness: There is no abdominal tenderness.  Musculoskeletal:        General: No swelling.     Cervical back: Neck supple.     Right lower leg: Tenderness present. No edema.     Left lower leg: Tenderness present. No edema.  Skin:    General: Skin is warm and dry.     Capillary Refill: Capillary refill takes less than 2 seconds.  Neurological:     Mental Status: She is alert.  Psychiatric:        Mood and Affect: Mood normal.     ED Results / Procedures / Treatments   Labs (all labs ordered are listed, but only abnormal results are displayed) Labs Reviewed  BASIC METABOLIC PANEL  CBC  TROPONIN I (HIGH SENSITIVITY)  TROPONIN I (HIGH SENSITIVITY)    EKG EKG  Interpretation Date/Time:  Saturday January 05 2023 09:13:39 EST Ventricular Rate:  82 PR Interval:  167 QRS Duration:  82 QT Interval:  389 QTC Calculation: 455 R Axis:   38  Text Interpretation: Sinus rhythm No significant change since last tracing Confirmed by Jacalyn Lefevre [phone]) on 01/05/2023 9:21:10 AM  Radiology CT Angio Chest PE W and/or Wo Contrast Result Date: 01/05/2023 CLINICAL DATA:  Chest pain for 5 days.  History of DVT. EXAM: CT ANGIOGRAPHY CHEST WITH CONTRAST TECHNIQUE: Multidetector CT imaging of the chest was performed using the standard protocol during bolus administration of intravenous contrast. Multiplanar CT image reconstructions and MIPs were obtained to evaluate the vascular anatomy. RADIATION DOSE REDUCTION: This exam was performed according to the departmental dose-optimization program which includes automated exposure control, adjustment of the mA and/or kV according to patient size and/or use of iterative reconstruction technique. CONTRAST:  75mL OMNIPAQUE IOHEXOL 350 MG/ML SOLN COMPARISON:  Chest CT 05/09/2021 FINDINGS: Cardiovascular: The heart is normal in size. No pericardial effusion. The aorta is normal in caliber. No dissection or atherosclerotic calcification. The branch vessels are patent. The pulmonary arterial tree is well opacified. No filling defects to suggest pulmonary embolism. Mediastinum/Nodes: No mediastinal or hilar mass or lymphadenopathy. Small scattered lymph nodes are stable. The esophagus is unremarkable. The thyroid gland is normal. Lungs/Pleura: No acute pulmonary findings. No worrisome pulmonary lesions or pulmonary nodules. No pleural effusion or pleural nodules. The central tracheobronchial tree is unremarkable. Upper Abdomen: No significant upper abdominal findings. Musculoskeletal: No significant bony findings. Review of the MIP images confirms the above findings. IMPRESSION: 1. No CT findings for pulmonary embolism. 2. Normal thoracic  aorta. 3. No acute pulmonary findings or pulmonary lesions. Electronically Signed   By: Rudie Meyer M.D.   On: 01/05/2023 15:27   US Venous Img Lower Bilateral (DVT) Result Date: 01/05/2023 CLINICAL DATA:  42 year old female bilateral calf EXAM: BILATERAL LOWER EXTREMITY VENOUS DOPPLER ULTRASOUND TECHNIQUE: Gray-scale sonography with graded compression, as well as color Doppler and duplex ultrasound were performed to evaluate the lower extremity deep venous systems from the level of the common femoral vein and including the common femoral, femoral, profunda femoral, popliteal and calf veins including the posterior tibial, peroneal and gastrocnemius veins when visible. The superficial great saphenous vein was also interrogated. Spectral Doppler was utilized to evaluate flow at rest and with distal augmentation maneuvers in the common femoral, femoral and popliteal veins. COMPARISON:  04/16/2022 FINDINGS: RIGHT LOWER EXTREMITY Common Femoral Vein: No evidence of  thrombus. Normal compressibility, respiratory phasicity and response to augmentation. Saphenofemoral Junction: No evidence of thrombus. Normal compressibility and flow on color Doppler imaging. Profunda Femoral Vein: No evidence of thrombus. Normal compressibility and flow on color Doppler imaging. Femoral Vein: No evidence of thrombus. Normal compressibility, respiratory phasicity and response to augmentation. Popliteal Vein: No evidence of thrombus. Normal compressibility, respiratory phasicity and response to augmentation. Calf Veins: No evidence of thrombus. Normal compressibility and flow on color Doppler imaging. Superficial Great Saphenous Vein: Nonocclusive, subacute appearing thrombus the level of the ankle. Otherwise patent. Other Findings:  None. LEFT LOWER EXTREMITY Common Femoral Vein: No evidence of thrombus. Normal compressibility, respiratory phasicity and response to augmentation. Saphenofemoral Junction: No evidence of thrombus. Normal  compressibility and flow on color Doppler imaging. Profunda Femoral Vein: No evidence of thrombus. Normal compressibility and flow on color Doppler imaging. Femoral Vein: No evidence of thrombus. Normal compressibility, respiratory phasicity and response to augmentation. Popliteal Vein: No evidence of thrombus. Normal compressibility, respiratory phasicity and response to augmentation. Calf Veins: No evidence of thrombus. Normal compressibility and flow on color Doppler imaging. Other Findings:  None. IMPRESSION: 1. No evidence of bilateral lower extremity deep vein thrombosis. 2. Subacute appearing superficial thrombophlebitis of the great saphenous vein to the level of the right ankle. No evidence of surrounding suppuration. Marliss Coots, MD [facility] Radiology Electronically Signed   By: Marliss Coots M.D.   On: 01/05/2023 11:02   DG Chest 2 View Result Date: 01/05/2023 CLINICAL DATA:  chest pain EXAM: CHEST - 2 VIEW COMPARISON:  09/29/2020. FINDINGS: Cardiac silhouette is unremarkable. No pneumothorax. No pleural effusion. The visualized skeletal structures are unremarkable. Alveolar opacity consistent with left lower lobepneumonia versus volume loss. Follow up recommended to confirm resolution as a precaution. IMPRESSION: Left basilar alveolar process may represent pneumonia. Follow-up after therapy recommended to confirm resolution. Electronically Signed   By: Layla Maw M.D.   On: 01/05/2023 09:55    Procedures Procedures    Medications Ordered in ED Medications  iohexol (OMNIPAQUE) 350 MG/ML injection 75 mL (75 mLs Intravenous Contrast Given 01/05/23 1052)    ED Course/ Medical Decision Making/ A&P                                 Medical Decision Making Amount and/or Complexity of Data Reviewed Labs: ordered. Radiology: ordered.  Risk Prescription drug management.   This patient presents to the ED with chief complaint(s) of  chest pain, bilateral leg pain.  The complaint involves an extensive differential diagnosis and also carries with it a high risk of complications and morbidity.   pertinent past medical history as listed in HPI  The differential diagnosis includes  PE, DVT, ACS, pneumonia The initial plan is to  Obtain basic labs, EKG, chest x-ray, bilateral Dopplers and PE study Additional history obtained: No additional historians Records reviewed previous admission documents  Initial Assessment Patient is overall well-appearing, she is not hypoxic, tachypneic or tachycardic.  He does have some pain in bilateral legs.  No significant swelling.  She has no cough or fever to suggest pneumonia.  Low concern for ACS.  She does have a history of PE/DVT although no clinical signs to suggest this today.  Reassuring findings include rib producible chest tenderness, bilateral leg pain without swelling.  Will still obtain imaging and basic lab work given history of PE/DVT  Independent ECG interpretation:  Sinus rhythm  without ischemic changes  Independent labs interpretation:  The following labs were independently interpreted:  CBC, BMP within normal limits, troponins without elevation  Independent visualization and interpretation of imaging: I independently visualized the following imaging with scope of interpretation limited to determining acute life threatening conditions related to emergency care: Bilateral Dopplers, which revealed no DVT, some thrombophlebitis, chest x-ray suggested possible  left basilar alveolar process, does not correlate with pneumonia clinically, evaluate on CT.  CT chest demonstrated no pulmonary embolism or acute findings  Treatment and Reassessment: No medications administered during visit  Consultations obtained:   None  Disposition:   Will be discharged home.  Encouraged follow-up with primary care provider later this week.  The patient has been appropriately medically screened  and/or stabilized in the ED. I have low suspicion for any other emergent medical condition which would require further screening, evaluation or treatment in the ED or require inpatient management. At time of discharge the patient is hemodynamically stable and in no acute distress. I have discussed work-up results and diagnosis with patient and answered all questions. Patient is agreeable with discharge plan. We discussed strict return precautions for returning to the emergency department and they verbalized understanding.     Social Determinants of Health:   None  This note was dictated with voice recognition software.  Despite best efforts at proofreading, errors may have occurred which can change the documentation meaning.          Final Clinical Impression(s) / ED Diagnoses Final diagnoses:  Atypical chest pain  Left leg pain  Right leg pain    Rx / DC Orders ED Discharge Orders     None         Halford Decamp, PA-C 01/05/23 1558    Jacalyn Lefevre, MD 01/06/23 1103

## 2023-11-10 ENCOUNTER — Emergency Department (HOSPITAL_BASED_OUTPATIENT_CLINIC_OR_DEPARTMENT_OTHER)

## 2023-11-10 ENCOUNTER — Emergency Department (HOSPITAL_BASED_OUTPATIENT_CLINIC_OR_DEPARTMENT_OTHER)
Admission: EM | Admit: 2023-11-10 | Discharge: 2023-11-10 | Disposition: A | Discharge status: Home / Self Care | Attending: Emergency Medicine | Admitting: Emergency Medicine

## 2023-11-10 ENCOUNTER — Encounter (HOSPITAL_BASED_OUTPATIENT_CLINIC_OR_DEPARTMENT_OTHER): Payer: Self-pay

## 2023-11-10 DIAGNOSIS — R569 Unspecified convulsions: Secondary | ICD-10-CM | POA: Diagnosis not present

## 2023-11-10 DIAGNOSIS — R0789 Other chest pain: Secondary | ICD-10-CM | POA: Insufficient documentation

## 2023-11-10 DIAGNOSIS — I10 Essential (primary) hypertension: Secondary | ICD-10-CM | POA: Insufficient documentation

## 2023-11-10 LAB — HEPATIC FUNCTION PANEL
ALT: 20 U/L (ref 0–44)
AST: 23 U/L (ref 15–41)
Albumin: 4.3 g/dL (ref 3.5–5.0)
Alkaline Phosphatase: 95 U/L (ref 38–126)
Bilirubin, Direct: 0.3 mg/dL — ABNORMAL HIGH (ref 0.0–0.2)
Indirect Bilirubin: 0.4 mg/dL (ref 0.3–0.9)
Total Bilirubin: 0.6 mg/dL (ref 0.0–1.2)
Total Protein: 7.2 g/dL (ref 6.5–8.1)

## 2023-11-10 LAB — CBC
HCT: 36 % (ref 36.0–46.0)
Hemoglobin: 11.8 g/dL — ABNORMAL LOW (ref 12.0–15.0)
MCH: 27.9 pg (ref 26.0–34.0)
MCHC: 32.8 g/dL (ref 30.0–36.0)
MCV: 85.1 fL (ref 80.0–100.0)
Platelets: 173 K/uL (ref 150–400)
RBC: 4.23 MIL/uL (ref 3.87–5.11)
RDW: 13.3 % (ref 11.5–15.5)
WBC: 5.5 K/uL (ref 4.0–10.5)
nRBC: 0 % (ref 0.0–0.2)

## 2023-11-10 LAB — TROPONIN T, HIGH SENSITIVITY: Troponin T High Sensitivity: <15 ng/L (ref 0–19)

## 2023-11-10 LAB — BASIC METABOLIC PANEL WITH GFR
Anion gap: 12 (ref 5–15)
BUN: 15 mg/dL (ref 6–20)
CO2: 24 mmol/L (ref 22–32)
Calcium: 9.1 mg/dL (ref 8.9–10.3)
Chloride: 106 mmol/L (ref 98–111)
Creatinine, Ser: 0.88 mg/dL (ref 0.44–1.00)
GFR, Estimated: >60 mL/min (ref >60)
Glucose, Bld: 91 mg/dL (ref 70–99)
Potassium: 4.2 mmol/L (ref 3.5–5.1)
Sodium: 141 mmol/L (ref 135–145)

## 2023-11-10 LAB — LIPASE, BLOOD: Lipase: 21 U/L (ref 11–51)

## 2023-11-10 LAB — D-DIMER, QUANTITATIVE: D-Dimer, Quant: 1.2 ug{FEU}/mL — ABNORMAL HIGH (ref 0.00–0.50)

## 2023-11-10 MED ORDER — IOHEXOL 350 MG/ML SOLN
75.0000 mL | Freq: Once | INTRAVENOUS | Status: AC | PRN
  Administered 2023-11-10: 100 mL via INTRAVENOUS
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
## 2023-11-10 NOTE — ED Triage Notes (Addendum)
 Pt reports some chest pain and shortness of breath. States that she just woke up with the pain. States that it has been occurring often. Reports that she also had a seizure yesterday. Pt also reports a headache.

## 2023-11-10 NOTE — ED Notes (Signed)
 Pt lives with her kids, not alone. Instructed her to call 911 if she feels like she may have a seizure (explained what an aura is) and to tell her kids to call 911 if she has a seizure or feels like it. Also instructed to consider [facility] ER for further evaluation due to on-call neurology and EEG availability.

## 2023-11-10 NOTE — ED Notes (Signed)
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
## 2023-11-10 NOTE — Discharge Instructions (Signed)
 You have been seen in the emergency department today for a likely seizure as well as chest pain.  Your workup today including labs are within normal limits.  Please follow up with your doctor as soon as possible regarding today's emergency department visit and your likely seizure.  You will also need to follow up with a neurologist as soon as possible, please call for appointment.  If you have been prescribed a medication for your seizures, please take this medication as prescribed.  As we have discussed it is very important that you DO NOT drive until you have been seen and cleared by your neurologist.  Please drink plenty of fluids, get plenty of sleep and avoid any alcohol or drug use.  Return to the emergency department if you have any further seizures, develop any weakness/numbness of any arm/leg, confusion, slurred speech, or sudden/severe headache.

## 2023-11-25 ENCOUNTER — Ambulatory Visit: Admitting: Neurology

## 2023-11-25 ENCOUNTER — Encounter: Payer: Self-pay | Admitting: Neurology

## 2023-11-25 VITALS — BP 125/84 | HR 91 | Ht 69.0 in | Wt 389.8 lb

## 2023-11-25 DIAGNOSIS — G4733 Obstructive sleep apnea (adult) (pediatric): Secondary | ICD-10-CM

## 2023-11-25 DIAGNOSIS — R55 Syncope and collapse: Secondary | ICD-10-CM

## 2023-11-25 DIAGNOSIS — G250 Essential tremor: Secondary | ICD-10-CM
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
## 2023-11-25 NOTE — Patient Instructions (Signed)
 Routine EEG, I will contact you to go over the results  Continue to follow up with your sleep doctor  Please call us  if your tremors do get worse

## 2023-12-02 ENCOUNTER — Encounter: Payer: Self-pay | Admitting: Neurology

## 2023-12-02 ENCOUNTER — Ambulatory Visit: Admitting: Neurology

## 2023-12-02 DIAGNOSIS — R55 Syncope and collapse: Secondary | ICD-10-CM | POA: Diagnosis not present

## 2023-12-02 NOTE — Procedures (Signed)
    History:  43 year old woman with syncope   EEG classification: Awake and drowsy  Duration: 25 minutes   Technical aspects: This EEG study was done with scalp electrodes positioned according to the 10-20 International system of electrode placement. Electrical activity was reviewed with band pass filter of 1-70Hz , sensitivity of 7 uV/mm, display speed of 63mm/sec with a 60Hz  notched filter applied as appropriate. EEG data were recorded continuously and digitally stored.   Description of the recording: The background rhythms of this recording consists of low amplitude with no clear PDR. Present in the anterior head region is a 15-20 Hz beta activity. Photic stimulation was performed, did not show any abnormalities. Hyperventilation was also performed, did not show any abnormalities. Drowsiness was manifested by background fragmentation. No abnormal epileptiform discharges seen during this recording. There was no focal slowing. There were no electrographic seizure identified.   Abnormality: None   Impression: This is essentially a normal awake EEG. No evidence of interictal epileptiform discharges. Normal EEGs, however, do not rule out epilepsy.    Nayan Proch, MD [facility]

## 2023-12-03 ENCOUNTER — Ambulatory Visit: Payer: Self-pay | Admitting: Neurology

## 2023-12-03 NOTE — Progress Notes (Signed)
Please call and inform patient that her EEG (Brain wave test) was normal. In particular, there were no epileptiform discharges and no seizures. No further action is required on this test at this time. Please keep any upcoming appointments or tests and  call us with any interim questions, concerns, problems or updates. Thanks,   Alric Ran, MD

## 2023-12-03 NOTE — Telephone Encounter (Signed)
 Pt was called, the results were relayed to her.
